# Patient Record
Sex: Male | Born: 1975 | Race: White | Hispanic: No | Marital: Single | Smoking: Former smoker
Health system: Southern US, Community
[De-identification: ages and names within clinical notes are randomized; demographics above are authoritative.]

## PROBLEM LIST (undated history)

## (undated) DIAGNOSIS — H3320 Serous retinal detachment, unspecified eye: Secondary | ICD-10-CM

## (undated) HISTORY — PX: EYE SURGERY: SHX253

## (undated) HISTORY — PX: FRACTURE SURGERY: SHX138

## (undated) HISTORY — PX: FEMUR FRACTURE SURGERY: SHX633

---

## 2005-10-04 HISTORY — PX: ANKLE FRACTURE SURGERY: SHX122

## 2013-10-12 ENCOUNTER — Encounter (INDEPENDENT_AMBULATORY_CARE_PROVIDER_SITE_OTHER): Payer: 59 | Admitting: Ophthalmology

## 2013-10-12 DIAGNOSIS — H33009 Unspecified retinal detachment with retinal break, unspecified eye: Secondary | ICD-10-CM

## 2013-10-12 DIAGNOSIS — S0510XA Contusion of eyeball and orbital tissues, unspecified eye, initial encounter: Secondary | ICD-10-CM

## 2013-10-12 DIAGNOSIS — H2700 Aphakia, unspecified eye: Secondary | ICD-10-CM

## 2013-10-12 DIAGNOSIS — H43819 Vitreous degeneration, unspecified eye: Secondary | ICD-10-CM

## 2013-10-13 NOTE — H&P (Signed)
Sean Walker is an 38 y.o. male.   Chief Complaint:loss of vision eleven months ago left eye HPI: Hx of trauma as a child, then retinal detachment eleven months ago, left eye  No past medical history on file.  No past surgical history on file.  No family history on file. Social History:  has no tobacco, alcohol, and drug history on file.  Allergies: Allergies not on file  No prescriptions prior to admission    Review of systems otherwise negative  There were no vitals taken for this visit.  Physical exam: Mental status: oriented x3. Eyes: See eye exam associated with this date of surgery in media tab.  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

## 2013-10-15 ENCOUNTER — Encounter (HOSPITAL_COMMUNITY): Payer: Self-pay | Admitting: Respiratory Therapy

## 2013-10-15 ENCOUNTER — Other Ambulatory Visit (HOSPITAL_COMMUNITY): Payer: Self-pay | Admitting: *Deleted

## 2013-10-15 ENCOUNTER — Encounter (HOSPITAL_COMMUNITY): Payer: Self-pay | Admitting: *Deleted

## 2013-10-15 MED ORDER — GATIFLOXACIN 0.5 % OP SOLN
1.0000 [drp] | OPHTHALMIC | Status: AC | PRN
  Administered 2013-10-16 (×3): 1 [drp] via OPHTHALMIC

## 2013-10-15 MED ORDER — CYCLOPENTOLATE HCL 1 % OP SOLN
1.0000 [drp] | OPHTHALMIC | Status: AC | PRN
  Administered 2013-10-16 (×3): 1 [drp] via OPHTHALMIC

## 2013-10-15 MED ORDER — PHENYLEPHRINE HCL 2.5 % OP SOLN
1.0000 [drp] | OPHTHALMIC | Status: AC | PRN
  Administered 2013-10-16 (×3): 1 [drp] via OPHTHALMIC

## 2013-10-15 MED ORDER — CEFAZOLIN SODIUM-DEXTROSE 2-3 GM-% IV SOLR
2.0000 g | INTRAVENOUS | Status: AC
  Administered 2013-10-16: 2 g via INTRAVENOUS

## 2013-10-15 MED ORDER — TROPICAMIDE 1 % OP SOLN
1.0000 [drp] | OPHTHALMIC | Status: AC | PRN
  Administered 2013-10-16 (×3): 1 [drp] via OPHTHALMIC

## 2013-10-16 ENCOUNTER — Ambulatory Visit (HOSPITAL_COMMUNITY)
Admission: RE | Admit: 2013-10-16 | Discharge: 2013-10-17 | Disposition: A | Discharge status: Home / Self Care | Payer: 59 | Source: Ambulatory Visit | Attending: Ophthalmology | Admitting: Ophthalmology

## 2013-10-16 ENCOUNTER — Ambulatory Visit (HOSPITAL_COMMUNITY): Payer: 59

## 2013-10-16 ENCOUNTER — Ambulatory Visit (HOSPITAL_COMMUNITY): Payer: 59 | Admitting: Certified Registered Nurse Anesthetist

## 2013-10-16 ENCOUNTER — Encounter (HOSPITAL_COMMUNITY): Payer: 59 | Admitting: Certified Registered Nurse Anesthetist

## 2013-10-16 ENCOUNTER — Encounter (HOSPITAL_COMMUNITY): Payer: Self-pay | Admitting: Surgery

## 2013-10-16 ENCOUNTER — Encounter (HOSPITAL_COMMUNITY)
Admission: RE | Disposition: A | Discharge status: Home / Self Care | Payer: Self-pay | Source: Ambulatory Visit | Attending: Ophthalmology

## 2013-10-16 DIAGNOSIS — H33009 Unspecified retinal detachment with retinal break, unspecified eye: Secondary | ICD-10-CM | POA: Insufficient documentation

## 2013-10-16 DIAGNOSIS — H2700 Aphakia, unspecified eye: Secondary | ICD-10-CM | POA: Insufficient documentation

## 2013-10-16 DIAGNOSIS — H33002 Unspecified retinal detachment with retinal break, left eye: Secondary | ICD-10-CM

## 2013-10-16 DIAGNOSIS — H3520 Other non-diabetic proliferative retinopathy, unspecified eye: Secondary | ICD-10-CM | POA: Insufficient documentation

## 2013-10-16 DIAGNOSIS — Z87891 Personal history of nicotine dependence: Secondary | ICD-10-CM | POA: Insufficient documentation

## 2013-10-16 HISTORY — PX: MEMBRANE PEEL: SHX5967

## 2013-10-16 HISTORY — DX: Serous retinal detachment, unspecified eye: H33.20

## 2013-10-16 HISTORY — PX: PERFLUORONE INJECTION: SHX5302

## 2013-10-16 HISTORY — PX: INJECTION OF SILICONE OIL: SHX6422

## 2013-10-16 HISTORY — PX: PHOTOCOAGULATION WITH LASER: SHX6027

## 2013-10-16 HISTORY — PX: IRIDECTOMY: SHX1848

## 2013-10-16 HISTORY — PX: SCLERAL BUCKLE WITH POSSIBLE 25 GAUGE PARS PLANA VITRECTOMY: SHX6206

## 2013-10-16 HISTORY — PX: RETINAL DETACHMENT REPAIR W/ SCLERAL BUCKLE LE: SHX2338

## 2013-10-16 HISTORY — PX: GAS/FLUID EXCHANGE: SHX5334

## 2013-10-16 LAB — CBC
HEMATOCRIT: 41 % (ref 39.0–52.0)
Hemoglobin: 14.8 g/dL (ref 13.0–17.0)
MCH: 32 pg (ref 26.0–34.0)
MCHC: 36.1 g/dL — ABNORMAL HIGH (ref 30.0–36.0)
MCV: 88.7 fL (ref 78.0–100.0)
Platelets: 221 10*3/uL (ref 150–400)
RBC: 4.62 MIL/uL (ref 4.22–5.81)
RDW: 12.8 % (ref 11.5–15.5)
WBC: 6.8 10*3/uL (ref 4.0–10.5)

## 2013-10-16 SURGERY — SCLERAL BUCKLE WITH POSSIBLE 25 GAUGE PARS PLANA VITRECTOMY
Anesthesia: General | Site: Eye | Laterality: Left

## 2013-10-16 MED ORDER — BACITRACIN-POLYMYXIN B 500-10000 UNIT/GM OP OINT
TOPICAL_OINTMENT | OPHTHALMIC | Status: DC | PRN
  Administered 2013-10-16: 1 via OPHTHALMIC

## 2013-10-16 MED ORDER — GENTAMICIN SULFATE 40 MG/ML IJ SOLN
INTRAMUSCULAR | Status: AC
Start: 2013-10-16 — End: 2013-10-16
  Filled 2013-10-16: qty 2

## 2013-10-16 MED ORDER — EPINEPHRINE HCL 1 MG/ML IJ SOLN
INTRAMUSCULAR | Status: DC | PRN
  Administered 2013-10-16: 12:00:00

## 2013-10-16 MED ORDER — HYDROCODONE-ACETAMINOPHEN 5-325 MG PO TABS
1.0000 | ORAL_TABLET | ORAL | Status: DC | PRN
  Administered 2013-10-17: 2 via ORAL
  Filled 2013-10-16 (×2): qty 2

## 2013-10-16 MED ORDER — DEXAMETHASONE SODIUM PHOSPHATE 10 MG/ML IJ SOLN
INTRAMUSCULAR | Status: DC | PRN
  Administered 2013-10-16: 10 mg

## 2013-10-16 MED ORDER — BUPIVACAINE HCL (PF) 0.75 % IJ SOLN
INTRAMUSCULAR | Status: DC | PRN
  Administered 2013-10-16: 10 mL

## 2013-10-16 MED ORDER — SODIUM CHLORIDE 0.9 % IJ SOLN
INTRAMUSCULAR | Status: AC
  Filled 2013-10-16: qty 10

## 2013-10-16 MED ORDER — NEOSTIGMINE METHYLSULFATE 1 MG/ML IJ SOLN
INTRAMUSCULAR | Status: DC | PRN
  Administered 2013-10-16: 5 mg via INTRAVENOUS

## 2013-10-16 MED ORDER — TEMAZEPAM 15 MG PO CAPS: 15.0000 mg | ORAL_CAPSULE | Freq: Every evening | ORAL | Status: DC | PRN

## 2013-10-16 MED ORDER — ACETAMINOPHEN 325 MG PO TABS: 325.0000 mg | ORAL_TABLET | ORAL | Status: DC | PRN

## 2013-10-16 MED ORDER — SODIUM CHLORIDE 0.45 % IV SOLN
INTRAVENOUS | Status: DC
  Administered 2013-10-16: 17:00:00 via INTRAVENOUS

## 2013-10-16 MED ORDER — 0.9 % SODIUM CHLORIDE (POUR BTL) OPTIME
TOPICAL | Status: DC | PRN
  Administered 2013-10-16: 1000 mL

## 2013-10-16 MED ORDER — SODIUM CHLORIDE 0.9 % IV SOLN
INTRAVENOUS | Status: DC
  Administered 2013-10-16 (×3): via INTRAVENOUS

## 2013-10-16 MED ORDER — DEXAMETHASONE SODIUM PHOSPHATE 10 MG/ML IJ SOLN
INTRAMUSCULAR | Status: AC
  Filled 2013-10-16: qty 1

## 2013-10-16 MED ORDER — PHENYLEPHRINE HCL 2.5 % OP SOLN
OPHTHALMIC | Status: AC
  Administered 2013-10-16: 10:00:00 1 [drp] via OPHTHALMIC
  Filled 2013-10-16: qty 15

## 2013-10-16 MED ORDER — GLYCOPYRROLATE 0.2 MG/ML IJ SOLN
INTRAMUSCULAR | Status: DC | PRN
Start: 2013-10-16 — End: 2013-10-16
  Administered 2013-10-16: .8 mg via INTRAVENOUS
  Administered 2013-10-16: 0.2 mg via INTRAVENOUS

## 2013-10-16 MED ORDER — EPINEPHRINE HCL 1 MG/ML IJ SOLN
INTRAMUSCULAR | Status: AC
  Filled 2013-10-16: qty 1

## 2013-10-16 MED ORDER — ONDANSETRON HCL 4 MG/2ML IJ SOLN
4.0000 mg | Freq: Four times a day (QID) | INTRAMUSCULAR | Status: DC | PRN
  Administered 2013-10-16: 4 mg via INTRAVENOUS
  Filled 2013-10-16: qty 2

## 2013-10-16 MED ORDER — ACETAZOLAMIDE SODIUM 500 MG IJ SOLR
INTRAMUSCULAR | Status: AC
  Filled 2013-10-16: qty 500

## 2013-10-16 MED ORDER — GATIFLOXACIN 0.5 % OP SOLN
1.0000 [drp] | Freq: Four times a day (QID) | OPHTHALMIC | Status: DC
  Filled 2013-10-16: qty 2.5

## 2013-10-16 MED ORDER — BACITRACIN-POLYMYXIN B 500-10000 UNIT/GM OP OINT
TOPICAL_OINTMENT | OPHTHALMIC | Status: AC
  Filled 2013-10-16: qty 3.5

## 2013-10-16 MED ORDER — LIDOCAINE HCL 2 % IJ SOLN
INTRAMUSCULAR | Status: AC
  Filled 2013-10-16: qty 20

## 2013-10-16 MED ORDER — MORPHINE SULFATE 2 MG/ML IJ SOLN
1.0000 mg | INTRAMUSCULAR | Status: AC | PRN
  Administered 2013-10-16 – 2013-10-17 (×2): 2 mg via INTRAVENOUS
  Filled 2013-10-16 (×2): qty 1

## 2013-10-16 MED ORDER — FENTANYL CITRATE 0.05 MG/ML IJ SOLN
INTRAMUSCULAR | Status: DC | PRN
  Administered 2013-10-16: 50 ug via INTRAVENOUS
  Administered 2013-10-16 (×2): 100 ug via INTRAVENOUS
  Administered 2013-10-16: 50 ug via INTRAVENOUS

## 2013-10-16 MED ORDER — OXYCODONE HCL 5 MG/5ML PO SOLN: 5.0000 mg | Freq: Once | ORAL | Status: AC | PRN

## 2013-10-16 MED ORDER — PREDNISOLONE ACETATE 1 % OP SUSP
1.0000 [drp] | Freq: Four times a day (QID) | OPHTHALMIC | Status: DC
  Filled 2013-10-16: qty 1
  Filled 2013-10-16: qty 5

## 2013-10-16 MED ORDER — SODIUM HYALURONATE 10 MG/ML IO SOLN
INTRAOCULAR | Status: AC
  Filled 2013-10-16: qty 0.85

## 2013-10-16 MED ORDER — TROPICAMIDE 1 % OP SOLN
OPHTHALMIC | Status: AC
  Administered 2013-10-16: 10:00:00 1 [drp] via OPHTHALMIC
  Filled 2013-10-16: qty 3

## 2013-10-16 MED ORDER — HEMOSTATIC AGENTS (NO CHARGE) OPTIME
TOPICAL | Status: DC | PRN
  Administered 2013-10-16: 1 via TOPICAL

## 2013-10-16 MED ORDER — POLYMYXIN B SULFATE 500000 UNITS IJ SOLR
INTRAMUSCULAR | Status: DC | PRN
  Administered 2013-10-16: 12:00:00

## 2013-10-16 MED ORDER — ATROPINE SULFATE 1 % OP SOLN
OPHTHALMIC | Status: AC
  Filled 2013-10-16: qty 2

## 2013-10-16 MED ORDER — HYPROMELLOSE (GONIOSCOPIC) 2.5 % OP SOLN
OPHTHALMIC | Status: AC
  Filled 2013-10-16: qty 15

## 2013-10-16 MED ORDER — BACITRACIN-POLYMYXIN B 500-10000 UNIT/GM OP OINT
1.0000 "application " | TOPICAL_OINTMENT | Freq: Four times a day (QID) | OPHTHALMIC | Status: DC
  Filled 2013-10-16: qty 3.5

## 2013-10-16 MED ORDER — TETRACAINE HCL 0.5 % OP SOLN
2.0000 [drp] | Freq: Once | OPHTHALMIC | Status: DC
  Filled 2013-10-16: qty 2

## 2013-10-16 MED ORDER — PROPOFOL 10 MG/ML IV BOLUS
INTRAVENOUS | Status: DC | PRN
  Administered 2013-10-16: 200 mg via INTRAVENOUS

## 2013-10-16 MED ORDER — BUPIVACAINE HCL (PF) 0.75 % IJ SOLN
INTRAMUSCULAR | Status: AC
Start: 2013-10-16 — End: 2013-10-16
  Filled 2013-10-16: qty 10

## 2013-10-16 MED ORDER — ONDANSETRON HCL 4 MG/2ML IJ SOLN
INTRAMUSCULAR | Status: DC | PRN
  Administered 2013-10-16: 4 mg via INTRAVENOUS

## 2013-10-16 MED ORDER — ROCURONIUM BROMIDE 100 MG/10ML IV SOLN
INTRAVENOUS | Status: DC | PRN
  Administered 2013-10-16: 50 mg via INTRAVENOUS
  Administered 2013-10-16: 20 mg via INTRAVENOUS
  Administered 2013-10-16: 30 mg via INTRAVENOUS

## 2013-10-16 MED ORDER — TRIAMCINOLONE ACETONIDE 40 MG/ML IJ SUSP
INTRAMUSCULAR | Status: AC
  Filled 2013-10-16: qty 5

## 2013-10-16 MED ORDER — ONDANSETRON HCL 4 MG/2ML IJ SOLN: 4.0000 mg | Freq: Once | INTRAMUSCULAR | Status: DC | PRN

## 2013-10-16 MED ORDER — POLYMYXIN B SULFATE 500000 UNITS IJ SOLR
INTRAMUSCULAR | Status: AC
  Filled 2013-10-16: qty 1

## 2013-10-16 MED ORDER — LIDOCAINE HCL (CARDIAC) 20 MG/ML IV SOLN
INTRAVENOUS | Status: DC | PRN
  Administered 2013-10-16: 70 mg via INTRAVENOUS

## 2013-10-16 MED ORDER — MAGNESIUM HYDROXIDE 400 MG/5ML PO SUSP: 15.0000 mL | Freq: Four times a day (QID) | ORAL | Status: DC | PRN

## 2013-10-16 MED ORDER — DOCUSATE SODIUM 100 MG PO CAPS
100.0000 mg | ORAL_CAPSULE | Freq: Two times a day (BID) | ORAL | Status: DC
  Filled 2013-10-16: qty 1

## 2013-10-16 MED ORDER — CYCLOPENTOLATE HCL 1 % OP SOLN
OPHTHALMIC | Status: AC
  Administered 2013-10-16: 10:00:00 1 [drp] via OPHTHALMIC
  Filled 2013-10-16: qty 2

## 2013-10-16 MED ORDER — BSS PLUS IO SOLN
INTRAOCULAR | Status: AC
  Filled 2013-10-16: qty 500

## 2013-10-16 MED ORDER — BRIMONIDINE TARTRATE 0.2 % OP SOLN
1.0000 [drp] | Freq: Two times a day (BID) | OPHTHALMIC | Status: DC
  Filled 2013-10-16: qty 5

## 2013-10-16 MED ORDER — SODIUM HYALURONATE 10 MG/ML IO SOLN
INTRAOCULAR | Status: DC | PRN
  Administered 2013-10-16: 0.85 mL via INTRAOCULAR

## 2013-10-16 MED ORDER — MIDAZOLAM HCL 5 MG/5ML IJ SOLN
INTRAMUSCULAR | Status: DC | PRN
  Administered 2013-10-16: 2 mg via INTRAVENOUS

## 2013-10-16 MED ORDER — LATANOPROST 0.005 % OP SOLN
1.0000 [drp] | Freq: Every day | OPHTHALMIC | Status: DC
  Filled 2013-10-16: qty 2.5

## 2013-10-16 MED ORDER — HYDROMORPHONE HCL PF 1 MG/ML IJ SOLN
0.2500 mg | INTRAMUSCULAR | Status: DC | PRN
  Administered 2013-10-16 (×2): 0.5 mg via INTRAVENOUS

## 2013-10-16 MED ORDER — HYDROMORPHONE HCL PF 1 MG/ML IJ SOLN
INTRAMUSCULAR | Status: AC
  Filled 2013-10-16: qty 1

## 2013-10-16 MED ORDER — ACETAZOLAMIDE SODIUM 500 MG IJ SOLR
500.0000 mg | Freq: Once | INTRAMUSCULAR | Status: AC
  Administered 2013-10-17: 500 mg via INTRAVENOUS
  Filled 2013-10-16: qty 500

## 2013-10-16 MED ORDER — GATIFLOXACIN 0.5 % OP SOLN
OPHTHALMIC | Status: AC
  Administered 2013-10-16: 10:00:00 1 [drp] via OPHTHALMIC
  Filled 2013-10-16: qty 2.5

## 2013-10-16 MED ORDER — BSS IO SOLN
INTRAOCULAR | Status: AC
  Filled 2013-10-16: qty 15

## 2013-10-16 MED ORDER — OXYCODONE HCL 5 MG PO TABS
5.0000 mg | ORAL_TABLET | Freq: Once | ORAL | Status: AC | PRN
  Administered 2013-10-16: 5 mg via ORAL

## 2013-10-16 MED ORDER — OXYCODONE HCL 5 MG PO TABS
ORAL_TABLET | ORAL | Status: AC
  Filled 2013-10-16: qty 1

## 2013-10-16 SURGICAL SUPPLY — 90 items
APPLICATOR DR MATTHEWS STRL (MISCELLANEOUS) ×24 IMPLANT
BLADE EYE CATARACT 19 1.4 BEAV (BLADE) IMPLANT
BLADE MVR KNIFE 19G (BLADE) IMPLANT
BLADE SURG 15 STRL LF DISP TIS (BLADE) IMPLANT
BLADE SURG 15 STRL SS (BLADE)
CANNULA ANT CHAM MAIN (OPHTHALMIC RELATED) IMPLANT
CANNULA DUAL BORE 23G (CANNULA) ×3 IMPLANT
CANNULA TROCAR 23 GA VLV (OPHTHALMIC) ×3 IMPLANT
CANNULA VLV SOFT TIP 25GA (OPHTHALMIC) ×3 IMPLANT
CORDS BIPOLAR (ELECTRODE) IMPLANT
COTTONBALL LRG STERILE PKG (GAUZE/BANDAGES/DRESSINGS) ×9 IMPLANT
COVER MAYO STAND STRL (DRAPES) ×3 IMPLANT
COVER SURGICAL LIGHT HANDLE (MISCELLANEOUS) ×3 IMPLANT
DRAPE INCISE 51X51 W/FILM STRL (DRAPES) ×3 IMPLANT
DRAPE OPHTHALMIC 77X100 STRL (CUSTOM PROCEDURE TRAY) ×3 IMPLANT
ERASER HMR WETFIELD 23G BP (MISCELLANEOUS) IMPLANT
FILTER BLUE MILLIPORE (MISCELLANEOUS) ×6 IMPLANT
FILTER STRAW FLUID ASPIR (MISCELLANEOUS) IMPLANT
FORCEPS GRIESHABER ILM 25G A (INSTRUMENTS) IMPLANT
GAS OPHTHALMIC (MISCELLANEOUS) IMPLANT
GLOVE BIOGEL PI IND STRL 6.5 (GLOVE) ×1 IMPLANT
GLOVE BIOGEL PI IND STRL 7.0 (GLOVE) ×3 IMPLANT
GLOVE BIOGEL PI IND STRL 7.5 (GLOVE) ×1 IMPLANT
GLOVE BIOGEL PI INDICATOR 6.5 (GLOVE) ×2
GLOVE BIOGEL PI INDICATOR 7.0 (GLOVE) ×6
GLOVE BIOGEL PI INDICATOR 7.5 (GLOVE) ×2
GLOVE SS BIOGEL STRL SZ 6.5 (GLOVE) ×2 IMPLANT
GLOVE SS BIOGEL STRL SZ 7 (GLOVE) ×1 IMPLANT
GLOVE SUPERSENSE BIOGEL SZ 6.5 (GLOVE) ×4
GLOVE SUPERSENSE BIOGEL SZ 7 (GLOVE) ×2
GLOVE SURG 8.5 LATEX PF (GLOVE) ×6 IMPLANT
GLOVE SURG SS PI 6.5 STRL IVOR (GLOVE) ×6 IMPLANT
GLOVE SURG SS PI 7.0 STRL IVOR (GLOVE) ×6 IMPLANT
GOWN STRL NON-REIN LRG LVL3 (GOWN DISPOSABLE) ×6 IMPLANT
HANDLE PNEUMATIC FOR CONSTEL (OPHTHALMIC) IMPLANT
IMPL SILICONE (Ophthalmic Related) ×1 IMPLANT
IMPLANT SILICONE (Ophthalmic Related) ×5 IMPLANT
KIT BASIN OR (CUSTOM PROCEDURE TRAY) ×3 IMPLANT
KIT PERFLUORON PROCEDURE 5ML (MISCELLANEOUS) ×3 IMPLANT
KIT ROOM TURNOVER OR (KITS) ×3 IMPLANT
KNIFE CRESCENT 1.75 EDGEAHEAD (BLADE) IMPLANT
KNIFE GRIESHABER SHARP 2.5MM (MISCELLANEOUS) ×12 IMPLANT
MASK EYE SHIELD (GAUZE/BANDAGES/DRESSINGS) ×3 IMPLANT
MICROPICK 25G (MISCELLANEOUS)
NEEDLE 18GX1X1/2 (RX/OR ONLY) (NEEDLE) ×6 IMPLANT
NEEDLE 25GX 5/8IN NON SAFETY (NEEDLE) IMPLANT
NEEDLE 27GAX1X1/2 (NEEDLE) IMPLANT
NEEDLE FILTER BLUNT 18X 1/2SAF (NEEDLE) ×2
NEEDLE FILTER BLUNT 18X1 1/2 (NEEDLE) ×1 IMPLANT
NEEDLE HYPO 30X.5 LL (NEEDLE) ×9 IMPLANT
NS IRRIG 1000ML POUR BTL (IV SOLUTION) ×3 IMPLANT
OIL SILICONE OPHTHALMIC ADAPTO (Ophthalmic Related) ×3 IMPLANT
PACK VITRECTOMY CUSTOM (CUSTOM PROCEDURE TRAY) ×3 IMPLANT
PAD ARMBOARD 7.5X6 YLW CONV (MISCELLANEOUS) ×6 IMPLANT
PAD EYE OVAL STERILE LF (GAUZE/BANDAGES/DRESSINGS) ×3 IMPLANT
PAK PIK VITRECTOMY CVS 25GA (OPHTHALMIC) ×3 IMPLANT
PAK VITRECTOMY PIK 25 GA (OPHTHALMIC RELATED) IMPLANT
PIC ILLUMINATED 25G (OPHTHALMIC) ×3
PICK MICROPICK 25G (MISCELLANEOUS) IMPLANT
PIK ILLUMINATED 25G (OPHTHALMIC) ×1 IMPLANT
PROBE LASER ILLUM FLEX CVD 25G (OPHTHALMIC) ×3 IMPLANT
REPL STRA BRUSH NEEDLE (NEEDLE) IMPLANT
RESERVOIR BACK FLUSH (MISCELLANEOUS) IMPLANT
ROLLS DENTAL (MISCELLANEOUS) ×6 IMPLANT
SET FLUID INJECTOR (SET/KITS/TRAYS/PACK) IMPLANT
SET INJECTOR OIL FLUID CONSTEL (OPHTHALMIC) ×3 IMPLANT
SET VGFI TUBING 8065808002 (SET/KITS/TRAYS/PACK) IMPLANT
SLEEVE SCLERAL TYPE 270 (Ophthalmic Related) ×3 IMPLANT
SPEAR EYE SURG WECK-CEL (MISCELLANEOUS) ×21 IMPLANT
SPONGE SURGIFOAM ABS GEL 12-7 (HEMOSTASIS) ×3 IMPLANT
STOPCOCK 4 WAY LG BORE MALE ST (IV SETS) IMPLANT
SUT CHROMIC 7 0 TG140 8 (SUTURE) ×3 IMPLANT
SUT ETHILON 9 0 TG140 8 (SUTURE) IMPLANT
SUT MERSILENE 4 0 RV 2 (SUTURE) ×6 IMPLANT
SUT SILK 2 0 (SUTURE) ×2
SUT SILK 2-0 18XBRD TIE 12 (SUTURE) ×1 IMPLANT
SUT SILK 4 0 RB 1 (SUTURE) ×3 IMPLANT
SUT VICRYL 7 0 TG140 8 (SUTURE) IMPLANT
SYR 20CC LL (SYRINGE) ×3 IMPLANT
SYR 5ML LL (SYRINGE) ×3 IMPLANT
SYR BULB 3OZ (MISCELLANEOUS) ×3 IMPLANT
SYR TB 1ML LUER SLIP (SYRINGE) ×3 IMPLANT
SYRINGE 10CC LL (SYRINGE) ×3 IMPLANT
TAPE SURG TRANSPORE 1 IN (GAUZE/BANDAGES/DRESSINGS) ×1 IMPLANT
TAPE SURGICAL TRANSPORE 1 IN (GAUZE/BANDAGES/DRESSINGS) ×2
TIRE RTNL 2.5XGRV CNCV 9X (Ophthalmic Related) ×1 IMPLANT
TOWEL OR 17X24 6PK STRL BLUE (TOWEL DISPOSABLE) ×9 IMPLANT
TUBING ART PRESS 12 MALE/MALE (MISCELLANEOUS) IMPLANT
WATER STERILE IRR 1000ML POUR (IV SOLUTION) ×3 IMPLANT
WIPE INSTRUMENT VISIWIPE 73X73 (MISCELLANEOUS) ×3 IMPLANT

## 2013-10-16 NOTE — Anesthesia Preprocedure Evaluation (Signed)
Anesthesia Evaluation  Patient identified by MRN, date of birth, ID band Patient awake    Reviewed: Allergy & Precautions, H&P , NPO status , Patient's Chart, lab work & pertinent test results  Airway Mallampati: I TM Distance: >3 FB Neck ROM: Full    Dental  (+) Teeth Intact and Dental Advisory Given   Pulmonary former smoker,  breath sounds clear to auscultation        Cardiovascular Rhythm:Regular Rate:Normal     Neuro/Psych    GI/Hepatic   Endo/Other    Renal/GU      Musculoskeletal   Abdominal   Peds  Hematology   Anesthesia Other Findings   Reproductive/Obstetrics                           Anesthesia Physical Anesthesia Plan  ASA: I  Anesthesia Plan: General   Post-op Pain Management:    Induction: Intravenous  Airway Management Planned: Oral ETT  Additional Equipment:   Intra-op Plan:   Post-operative Plan: Extubation in OR  Informed Consent: I have reviewed the patients History and Physical, chart, labs and discussed the procedure including the risks, benefits and alternatives for the proposed anesthesia with the patient or authorized representative who has indicated his/her understanding and acceptance.   Dental advisory given  Plan Discussed with: CRNA, Anesthesiologist and Surgeon  Anesthesia Plan Comments:         Anesthesia Quick Evaluation

## 2013-10-16 NOTE — Transfer of Care (Signed)
Immediate Anesthesia Transfer of Care Note  Patient: Caron PresumeZachariah Saavedra  Procedure(s) Performed: Procedure(s) with comments: SCLERAL BUCKLE WITH 23 and 25 GAUGE PARS PLANA VITRECTOMY  (Left) AIR/FLUID EXCHANGE (Left) PHOTOCOAGULATION WITH LASER (Left) - ENDOLASER PERFLUORON  INJECTION (Left) MEMBRANE PEEL (Left) IRIDECTOMY (Left) INJECTION OF SILICONE OIL (Left)  Patient Location: PACU  Anesthesia Type:General  Level of Consciousness: awake and alert   Airway & Oxygen Therapy: Patient Spontanous Breathing and Patient connected to nasal cannula oxygen  Post-op Assessment: Report given to PACU RN, Post -op Vital signs reviewed and stable and Patient moving all extremities X 4  Post vital signs: Reviewed and stable  Complications: No apparent anesthesia complications

## 2013-10-16 NOTE — Brief Op Note (Signed)
Brief Operative note   Preoperative diagnosis:  retinal detachment with PVR Postoperative diagnosis  Post-Op Diagnosis Codes:    * Retinal detachment with retinal defect, unspecified [361.00]  Procedures: Repair of complex retinal detachment with scleral buckle, vitrectomy, laser, gas injection, perfluoron injection and removal, silicone oil injection. Left eye  Surgeon:  Sherrie GeorgeJohn D Matthews, MD...  Assistant:  Rosalie DoctorLisa Johnson SA    Anesthesia: General  Specimen: none  Estimated blood loss:  1cc  Complications: none  Patient sent to PACU in good condition  Composed by Sherrie GeorgeJohn D Matthews MD  Dictation number: [id number]

## 2013-10-16 NOTE — H&P (Signed)
I examined the patient today and there is no change in the medical status 

## 2013-10-16 NOTE — Anesthesia Postprocedure Evaluation (Signed)
  Anesthesia Post-op Note  Patient: Sean Walker  Procedure(s) Performed: Procedure(s) with comments: SCLERAL BUCKLE WITH 23 and 25 GAUGE PARS PLANA VITRECTOMY  (Left) AIR/FLUID EXCHANGE (Left) PHOTOCOAGULATION WITH LASER (Left) - ENDOLASER PERFLUORON  INJECTION (Left) MEMBRANE PEEL (Left) IRIDECTOMY (Left) INJECTION OF SILICONE OIL (Left)  Patient Location: PACU  Anesthesia Type:General  Level of Consciousness: awake, alert  and oriented  Airway and Oxygen Therapy: Patient Spontanous Breathing  Post-op Pain: none  Post-op Assessment: Post-op Vital signs reviewed, Patient's Cardiovascular Status Stable, Respiratory Function Stable, Patent Airway, No signs of Nausea or vomiting and Pain level controlled  Post-op Vital Signs: Reviewed and stable  Complications: No apparent anesthesia complications

## 2013-10-16 NOTE — Preoperative (Signed)
Beta Blockers   Reason not to administer Beta Blockers:Not Applicable 

## 2013-10-16 NOTE — Anesthesia Procedure Notes (Signed)
Procedure Name: Intubation Date/Time: 10/16/2013 11:45 AM Performed by: Reine JustFLOWERS, Aydan Phoenix T Pre-anesthesia Checklist: Patient identified, Emergency Drugs available, Suction available, Patient being monitored and Timeout performed Patient Re-evaluated:Patient Re-evaluated prior to inductionOxygen Delivery Method: Circle system utilized and Simple face mask Preoxygenation: Pre-oxygenation with 100% oxygen Intubation Type: IV induction Ventilation: Mask ventilation without difficulty Laryngoscope Size: Miller and 3 Grade View: Grade II Tube type: Oral Tube size: 7.5 mm Number of attempts: 1 Airway Equipment and Method: Patient positioned with wedge pillow and Stylet Placement Confirmation: ETT inserted through vocal cords under direct vision,  positive ETCO2 and breath sounds checked- equal and bilateral Secured at: 23 cm Tube secured with: Tape Dental Injury: Teeth and Oropharynx as per pre-operative assessment

## 2013-10-17 DIAGNOSIS — H33009 Unspecified retinal detachment with retinal break, unspecified eye: Secondary | ICD-10-CM

## 2013-10-17 MED ORDER — GATIFLOXACIN 0.5 % OP SOLN: 1.0000 [drp] | Freq: Four times a day (QID) | OPHTHALMIC | Status: DC

## 2013-10-17 MED ORDER — BACITRACIN-POLYMYXIN B 500-10000 UNIT/GM OP OINT: 1.0000 "application " | TOPICAL_OINTMENT | Freq: Four times a day (QID) | OPHTHALMIC | Status: DC

## 2013-10-17 MED ORDER — HYDROCODONE-ACETAMINOPHEN 5-325 MG PO TABS: 1.0000 | ORAL_TABLET | ORAL | Status: AC | PRN

## 2013-10-17 MED ORDER — LATANOPROST 0.005 % OP SOLN: 1.0000 [drp] | Freq: Every day | OPHTHALMIC | Status: AC

## 2013-10-17 MED ORDER — PREDNISOLONE ACETATE 1 % OP SUSP
1.0000 [drp] | Freq: Four times a day (QID) | OPHTHALMIC | Status: DC
Start: 2013-10-17 — End: 2014-06-11

## 2013-10-17 NOTE — Op Note (Signed)
NAMEROSEMARY, PENTECOST             ACCOUNT NO.:  [id number]  MEDICAL RECORD NO.:  [id number]  LOCATION:  6N22C                        FACILITY:  [facility]  PHYSICIAN:  Beulah Gandy. Ashley Royalty, M.D. 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  PROCEDURES:  Scleral buckle, pars plana vitrectomy, retinal photocoagulation, Perfluoron injection, Perfluoron removal, gas fluid exchange, silicone oil placement, retinal photocoagulation, and peripheral iridectomy, all in the left eye.  SURGEON:  Beulah Gandy. Ashley Royalty, M.D.  ASSISTANT:  Rosalie Doctor, SA.  ANESTHESIA:  General.  DETAILS:  After usual prep and drape, 360 degree limbal peritomy, isolation of 4 rectus muscles on 2-0 silk.  Scleral dissection for 360 degrees to admit a #279 intrascleral implant.  Diathermy placed in the bed.  Two sutures per quadrant for total of 8 scleral sutures were placed in the scleral flaps, 279 implant was placed around the globe with a 240 band and a 270 sleeve.  One 2 mm were trimmed from the posterior edge of the scleral buckle.  Once the buckle was placed, perforation was chosen at 5 o'clock.  A large amount of yellow thin subretinal fluid came forth in a controlled manner.  The buckle elements were placed.  The scleral flaps were closed to perforate.  The attention was then carried to the pars plana area where 25-gauge trocars were placed at 10 and 4 o'clock and 23-gauge at 2 o'clock.  Provisc was placed on the corneal surface.  Pars plana vitrectomy was begun just behind the pupillary axis.  A peripheral iridectomy was created at 6 o'clock with the vitreous cutter.  The retina was thrown into folds with star folds and PVR.  Careful attention was taken to removing all vitreous attachments to the  macula and to the retinal surface.  PFO was injected as membranes were stripped from the retinal surface.  These membranes were then trimmed and removed with the vitreous cutter.  The standard and the wide field BIOM viewing system were used for best viewing.  The external drain was used for additional subretinal fluid removal.  PFO was injected and BSS was injected as fluid egress to the external drain.  The fluid was darky yellow in color.  Gas fluid exchange was carried out 50% and PFO was placed 50% to reattach the retina.  Once all of the subretinal fluid was drained through the external drain.  A total gas fluid exchange was carried out and the PFO was carefully removed.  Every particles PFO was removed.  The endolaser was then positioned in the eye, 827 burns were placed around the retinal periphery.  The power of 400 mW, 1000 microns each and 0.1 seconds each. The retina was fully attached at this point, with star folds on the scleral buckle.  Silicone oil was then injected into the vitreous cavity for a permanent attachment.  The level of oil was brought just right to the pupillary axis.  No implant or lens was present.  The patient was aphakic.  The pressures were  adjusted.  The trocars were removed.  The scleral sutures were knotted and the free ends removed.  The buckle was adjusted.  The band was adjusted.  The buckle and band were trimmed. The conjunctiva was reposited with 7-0 chromic suture.  Polymyxin and gentamicin were irrigated into tenon space.  Marcaine was injected around the globe for postop pain.  Atropine solution was applied. Decadron 10 mg was injected into the lower conjunctival space.  The closing pressure was 10 with a Baer care tonometer.  COMPLICATIONS:  None.  DURATION:  3 hours.  Polysporin ophthalmic ointment, a patch and shield were placed.  The patient was awakened, taken to recovery in satisfactory condition.     Beulah GandyJohn D. Ashley RoyaltyMatthews,  M.D.     JDM/MEDQ  D:  10/16/2013  T:  10/17/2013  Job:  [id number]

## 2013-10-17 NOTE — Discharge Summary (Signed)
Discharge summary not needed on [facility] patients per medical records. 

## 2013-10-17 NOTE — Progress Notes (Signed)
10/17/2013, 6:36 AM  Mental Status:  Awake, Alert, Oriented  Anterior segment: Cornea  Clear    Anterior Chamber Clear    Lens:   Aphakia  Intra Ocular Pressure 32 mmHg with Tonopen  Vitreous:   Silicone oil  Retina:  Attached Good laser reaction   Impression: Excellent result Retina attached   Final Diagnosis: Active Problems:   Rhegmatogenous retinal detachment of left eye   Plan: start post operative eye drops.  Add glaucoma drops.  Discharge to home.  Give post operative instructions  Sherrie GeorgeMATTHEWS, Kamyrah Feeser D 10/17/2013, 6:36 AM

## 2013-10-18 ENCOUNTER — Encounter (HOSPITAL_COMMUNITY): Payer: Self-pay | Admitting: Ophthalmology

## 2013-10-22 ENCOUNTER — Inpatient Hospital Stay (INDEPENDENT_AMBULATORY_CARE_PROVIDER_SITE_OTHER): Payer: 59 | Admitting: Ophthalmology

## 2013-10-22 DIAGNOSIS — H33009 Unspecified retinal detachment with retinal break, unspecified eye: Secondary | ICD-10-CM

## 2013-10-23 ENCOUNTER — Encounter (INDEPENDENT_AMBULATORY_CARE_PROVIDER_SITE_OTHER): Payer: 59 | Admitting: Ophthalmology

## 2013-10-23 DIAGNOSIS — H33009 Unspecified retinal detachment with retinal break, unspecified eye: Secondary | ICD-10-CM

## 2013-10-29 ENCOUNTER — Encounter (INDEPENDENT_AMBULATORY_CARE_PROVIDER_SITE_OTHER): Payer: 59 | Admitting: Ophthalmology

## 2013-10-29 DIAGNOSIS — H33009 Unspecified retinal detachment with retinal break, unspecified eye: Secondary | ICD-10-CM

## 2013-11-12 ENCOUNTER — Encounter (INDEPENDENT_AMBULATORY_CARE_PROVIDER_SITE_OTHER): Payer: 59 | Admitting: Ophthalmology

## 2013-11-12 DIAGNOSIS — H33009 Unspecified retinal detachment with retinal break, unspecified eye: Secondary | ICD-10-CM

## 2013-11-19 ENCOUNTER — Ambulatory Visit (INDEPENDENT_AMBULATORY_CARE_PROVIDER_SITE_OTHER): Payer: 59 | Admitting: Ophthalmology

## 2013-11-20 ENCOUNTER — Encounter (INDEPENDENT_AMBULATORY_CARE_PROVIDER_SITE_OTHER): Payer: 59 | Admitting: Ophthalmology

## 2013-11-26 ENCOUNTER — Ambulatory Visit (INDEPENDENT_AMBULATORY_CARE_PROVIDER_SITE_OTHER): Payer: 59 | Admitting: Ophthalmology

## 2014-04-01 ENCOUNTER — Encounter (INDEPENDENT_AMBULATORY_CARE_PROVIDER_SITE_OTHER): Payer: 59 | Admitting: Ophthalmology

## 2014-04-01 DIAGNOSIS — H43819 Vitreous degeneration, unspecified eye: Secondary | ICD-10-CM

## 2014-04-01 DIAGNOSIS — H33009 Unspecified retinal detachment with retinal break, unspecified eye: Secondary | ICD-10-CM

## 2014-05-29 ENCOUNTER — Encounter (INDEPENDENT_AMBULATORY_CARE_PROVIDER_SITE_OTHER): Payer: 59 | Admitting: Ophthalmology

## 2014-06-04 NOTE — H&P (Signed)
Sean Walker is an 38 y.o. male.   Chief Complaint:poor vision left eye HPI: had retina repaired with silicone oil, now time to remove oil left eye  Past Medical History  Diagnosis Date  . Detached retina     left eye    Past Surgical History  Procedure Laterality Date  . Fracture surgery    . Ankle fracture surgery Right 2007     pins/screws  . Femur fracture surgery Left     as a child  . Retinal detachment repair w/ scleral buckle le Left 10/16/2013  . Eye surgery Left     eye injury surgery as a child  . Scleral buckle with possible 25 gauge pars plana vitrectomy Left 10/16/2013    Procedure: SCLERAL BUCKLE WITH 23 and 25 GAUGE PARS PLANA VITRECTOMY ;  Surgeon: Sherrie George, MD;  Location: [facility] OR;  Service: Ophthalmology;  Laterality: Left;  . Gas/fluid exchange Left 10/16/2013    Procedure: AIR/FLUID EXCHANGE;  Surgeon: Sherrie George, MD;  Location: [facility] OR;  Service: Ophthalmology;  Laterality: Left;  . Photocoagulation with laser Left 10/16/2013    Procedure: PHOTOCOAGULATION WITH LASER;  Surgeon: Sherrie George, MD;  Location: [facility] OR;  Service: Ophthalmology;  Laterality: Left;  ENDOLASER  . Perfluorone injection Left 10/16/2013    Procedure: PERFLUORON  INJECTION;  Surgeon: Sherrie George, MD;  Location: [facility] OR;  Service: Ophthalmology;  Laterality: Left;  Marland Kitchen Membrane peel Left 10/16/2013    Procedure: MEMBRANE PEEL;  Surgeon: Sherrie George, MD;  Location: [facility] OR;  Service: Ophthalmology;  Laterality: Left;  . Iridectomy Left 10/16/2013    Procedure: IRIDECTOMY;  Surgeon: Sherrie George, MD;  Location: [facility] OR;  Service: Ophthalmology;  Laterality: Left;  . Injection of silicone oil Left 10/16/2013    Procedure: INJECTION OF SILICONE OIL;  Surgeon: Sherrie George, MD;  Location: [facility] OR;  Service: Ophthalmology;  Laterality: Left;    No family history on file. Social History:  reports that he quit smoking about 18 months ago. His smoking use included Cigarettes. He smoked  0.00 packs per day. He has quit using smokeless tobacco. His smokeless tobacco use included Snuff and Chew. He reports that he drinks about 12.6 ounces of alcohol per week. He reports that he does not use illicit drugs.  Allergies: No Known Allergies  No prescriptions prior to admission    Review of systems otherwise negative  There were no vitals taken for this visit.  Physical exam: Mental status: oriented x3. Eyes: See eye exam associated with this date of surgery in media tab.  Scanned in by scanning center Ears, Nose, Throat: within normal limits Neck: Within Normal limits General: within normal limits Chest: Within normal limits Breast: deferred Heart: Within normal limits Abdomen: Within normal limits GU: deferred Extremities: within normal limits Skin: within normal limits  Assessment/Plan Silicone oil present in vitreous cavity left eye Plan: To [facility] for Pars plana vitrectomy, removal of silicone oil, laser, gas injection left eye.  Sherrie George 06/04/2014, 3:50 PM

## 2014-06-07 ENCOUNTER — Encounter (HOSPITAL_COMMUNITY): Payer: Self-pay | Admitting: *Deleted

## 2014-06-10 MED ORDER — PHENYLEPHRINE HCL 2.5 % OP SOLN: 1.0000 [drp] | OPHTHALMIC | Status: DC | PRN

## 2014-06-10 MED ORDER — CYCLOPENTOLATE HCL 1 % OP SOLN: 1.0000 [drp] | OPHTHALMIC | Status: DC | PRN

## 2014-06-10 MED ORDER — CEFAZOLIN SODIUM-DEXTROSE 2-3 GM-% IV SOLR
2.0000 g | INTRAVENOUS | Status: AC
  Administered 2014-06-11: 2 g via INTRAVENOUS
  Filled 2014-06-10: qty 50

## 2014-06-10 MED ORDER — TROPICAMIDE 1 % OP SOLN: 1.0000 [drp] | OPHTHALMIC | Status: DC | PRN

## 2014-06-10 MED ORDER — GATIFLOXACIN 0.5 % OP SOLN: 1.0000 [drp] | OPHTHALMIC | Status: DC | PRN

## 2014-06-11 ENCOUNTER — Encounter (INDEPENDENT_AMBULATORY_CARE_PROVIDER_SITE_OTHER): Payer: 59 | Admitting: Ophthalmology

## 2014-06-11 ENCOUNTER — Encounter (HOSPITAL_COMMUNITY)
Admission: RE | Disposition: A | Discharge status: Home / Self Care | Payer: Self-pay | Source: Ambulatory Visit | Attending: Ophthalmology

## 2014-06-11 ENCOUNTER — Encounter (HOSPITAL_COMMUNITY): Payer: 59

## 2014-06-11 ENCOUNTER — Encounter (HOSPITAL_COMMUNITY): Payer: Self-pay | Admitting: Certified Registered Nurse Anesthetist

## 2014-06-11 ENCOUNTER — Ambulatory Visit (HOSPITAL_COMMUNITY): Payer: 59

## 2014-06-11 ENCOUNTER — Ambulatory Visit (HOSPITAL_COMMUNITY)
Admission: RE | Admit: 2014-06-11 | Discharge: 2014-06-12 | Disposition: A | Discharge status: Home / Self Care | Payer: 59 | Source: Ambulatory Visit | Attending: Ophthalmology | Admitting: Ophthalmology

## 2014-06-11 DIAGNOSIS — H33002 Unspecified retinal detachment with retinal break, left eye: Secondary | ICD-10-CM | POA: Diagnosis present

## 2014-06-11 DIAGNOSIS — S0510XA Contusion of eyeball and orbital tissues, unspecified eye, initial encounter: Secondary | ICD-10-CM

## 2014-06-11 DIAGNOSIS — H33009 Unspecified retinal detachment with retinal break, unspecified eye: Secondary | ICD-10-CM

## 2014-06-11 DIAGNOSIS — H43819 Vitreous degeneration, unspecified eye: Secondary | ICD-10-CM

## 2014-06-11 HISTORY — PX: PARS PLANA VITRECTOMY: SHX2166

## 2014-06-11 HISTORY — PX: GAS/FLUID EXCHANGE: SHX5334

## 2014-06-11 LAB — CBC
HCT: 37.7 % — ABNORMAL LOW (ref 39.0–52.0)
HEMOGLOBIN: 13.2 g/dL (ref 13.0–17.0)
MCH: 32.2 pg (ref 26.0–34.0)
MCHC: 35 g/dL (ref 30.0–36.0)
MCV: 92 fL (ref 78.0–100.0)
PLATELETS: 277 10*3/uL (ref 150–400)
RBC: 4.1 MIL/uL — ABNORMAL LOW (ref 4.22–5.81)
RDW: 12.4 % (ref 11.5–15.5)
WBC: 7.5 10*3/uL (ref 4.0–10.5)

## 2014-06-11 SURGERY — 25 GAUGE PARS PLANA VITRECTOMY WITH 23 GAUGE MVR PORT
Anesthesia: General | Site: Eye | Laterality: Left

## 2014-06-11 MED ORDER — LOTEPREDNOL ETABONATE 0.5 % OP SUSP
1.0000 [drp] | Freq: Four times a day (QID) | OPHTHALMIC | Status: DC
  Filled 2014-06-11: qty 5

## 2014-06-11 MED ORDER — TETRACAINE HCL 0.5 % OP SOLN
2.0000 [drp] | Freq: Once | OPHTHALMIC | Status: DC
  Filled 2014-06-11: qty 2

## 2014-06-11 MED ORDER — BSS PLUS IO SOLN: INTRAOCULAR | Status: DC | PRN

## 2014-06-11 MED ORDER — FENTANYL CITRATE 0.05 MG/ML IJ SOLN
25.0000 ug | INTRAMUSCULAR | Status: DC | PRN
  Administered 2014-06-11: 50 ug via INTRAVENOUS

## 2014-06-11 MED ORDER — DOCUSATE SODIUM 100 MG PO CAPS
100.0000 mg | ORAL_CAPSULE | Freq: Two times a day (BID) | ORAL | Status: DC
  Filled 2014-06-11: qty 1

## 2014-06-11 MED ORDER — BUPIVACAINE HCL (PF) 0.75 % IJ SOLN
INTRAMUSCULAR | Status: AC
  Filled 2014-06-11: qty 10

## 2014-06-11 MED ORDER — FENTANYL CITRATE 0.05 MG/ML IJ SOLN
INTRAMUSCULAR | Status: AC
  Filled 2014-06-11: qty 5

## 2014-06-11 MED ORDER — BACITRACIN-POLYMYXIN B 500-10000 UNIT/GM OP OINT: 1.0000 "application " | TOPICAL_OINTMENT | Freq: Four times a day (QID) | OPHTHALMIC | Status: DC

## 2014-06-11 MED ORDER — CYCLOPENTOLATE HCL 1 % OP SOLN
1.0000 [drp] | OPHTHALMIC | Status: AC | PRN
  Administered 2014-06-11 (×3): 1 [drp] via OPHTHALMIC
  Filled 2014-06-11: qty 2

## 2014-06-11 MED ORDER — PROPOFOL 10 MG/ML IV BOLUS
INTRAVENOUS | Status: AC
  Filled 2014-06-11: qty 20

## 2014-06-11 MED ORDER — SUCCINYLCHOLINE CHLORIDE 20 MG/ML IJ SOLN
INTRAMUSCULAR | Status: AC
  Filled 2014-06-11: qty 1

## 2014-06-11 MED ORDER — SODIUM CHLORIDE 0.9 % IV SOLN
INTRAVENOUS | Status: DC
Start: 2014-06-11 — End: 2014-06-11
  Administered 2014-06-11: 11:00:00 via INTRAVENOUS

## 2014-06-11 MED ORDER — OXYCODONE HCL 5 MG PO TABS: 5.0000 mg | ORAL_TABLET | Freq: Once | ORAL | Status: DC | PRN

## 2014-06-11 MED ORDER — NEOSTIGMINE METHYLSULFATE 10 MG/10ML IV SOLN
INTRAVENOUS | Status: AC
  Filled 2014-06-11: qty 2

## 2014-06-11 MED ORDER — ACETAZOLAMIDE ER 500 MG PO CP12
500.0000 mg | ORAL_CAPSULE | Freq: Two times a day (BID) | ORAL | Status: DC
  Administered 2014-06-11 (×2): 500 mg via ORAL
  Filled 2014-06-11 (×4): qty 1

## 2014-06-11 MED ORDER — POLYMYXIN B SULFATE 500000 UNITS IJ SOLR
INTRAMUSCULAR | Status: AC
  Filled 2014-06-11: qty 1

## 2014-06-11 MED ORDER — FENTANYL CITRATE 0.05 MG/ML IJ SOLN: INTRAMUSCULAR | Status: DC | PRN

## 2014-06-11 MED ORDER — SODIUM CHLORIDE 0.9 % IJ SOLN
INTRAMUSCULAR | Status: AC
Start: 2014-06-11 — End: 2014-06-11
  Filled 2014-06-11: qty 10

## 2014-06-11 MED ORDER — GLYCOPYRROLATE 0.2 MG/ML IJ SOLN
INTRAMUSCULAR | Status: AC
  Filled 2014-06-11: qty 4

## 2014-06-11 MED ORDER — BRIMONIDINE TARTRATE 0.15 % OP SOLN: 1.0000 [drp] | Freq: Three times a day (TID) | OPHTHALMIC | Status: DC

## 2014-06-11 MED ORDER — PREDNISOLONE ACETATE 1 % OP SUSP: 1.0000 [drp] | Freq: Four times a day (QID) | OPHTHALMIC | Status: DC

## 2014-06-11 MED ORDER — ACETAZOLAMIDE SODIUM 500 MG IJ SOLR
500.0000 mg | Freq: Once | INTRAMUSCULAR | Status: AC
  Administered 2014-06-12: 500 mg via INTRAVENOUS
  Filled 2014-06-11: qty 500

## 2014-06-11 MED ORDER — LATANOPROST 0.005 % OP SOLN
1.0000 [drp] | Freq: Every day | OPHTHALMIC | Status: DC
  Filled 2014-06-11: qty 2.5

## 2014-06-11 MED ORDER — MIDAZOLAM HCL 5 MG/5ML IJ SOLN
INTRAMUSCULAR | Status: DC | PRN
  Administered 2014-06-11: 2 mg via INTRAVENOUS

## 2014-06-11 MED ORDER — MAGNESIUM HYDROXIDE 400 MG/5ML PO SUSP: 15.0000 mL | Freq: Four times a day (QID) | ORAL | Status: DC | PRN

## 2014-06-11 MED ORDER — DEXAMETHASONE SODIUM PHOSPHATE 10 MG/ML IJ SOLN
INTRAMUSCULAR | Status: AC
  Filled 2014-06-11: qty 1

## 2014-06-11 MED ORDER — PHENYLEPHRINE HCL 2.5 % OP SOLN
1.0000 [drp] | OPHTHALMIC | Status: AC | PRN
  Administered 2014-06-11 (×3): 1 [drp] via OPHTHALMIC
  Filled 2014-06-11: qty 2

## 2014-06-11 MED ORDER — LIDOCAINE HCL (CARDIAC) 20 MG/ML IV SOLN
INTRAVENOUS | Status: DC | PRN
  Administered 2014-06-11: 60 mg via INTRAVENOUS

## 2014-06-11 MED ORDER — STERILE WATER FOR INJECTION IJ SOLN
INTRAMUSCULAR | Status: AC
  Filled 2014-06-11: qty 10

## 2014-06-11 MED ORDER — ROCURONIUM BROMIDE 100 MG/10ML IV SOLN
INTRAVENOUS | Status: DC | PRN
  Administered 2014-06-11: 20 mg via INTRAVENOUS

## 2014-06-11 MED ORDER — MIDAZOLAM HCL 2 MG/2ML IJ SOLN
INTRAMUSCULAR | Status: AC
  Filled 2014-06-11: qty 2

## 2014-06-11 MED ORDER — LATANOPROST 0.005 % OP SOLN: 1.0000 [drp] | Freq: Every day | OPHTHALMIC | Status: DC

## 2014-06-11 MED ORDER — TROPICAMIDE 1 % OP SOLN
1.0000 [drp] | OPHTHALMIC | Status: AC | PRN
  Administered 2014-06-11 (×3): 1 [drp] via OPHTHALMIC
  Filled 2014-06-11: qty 3

## 2014-06-11 MED ORDER — TEMAZEPAM 15 MG PO CAPS
15.0000 mg | ORAL_CAPSULE | Freq: Every evening | ORAL | Status: DC | PRN
  Administered 2014-06-11: 15 mg via ORAL
  Filled 2014-06-11: qty 1

## 2014-06-11 MED ORDER — BACITRACIN-POLYMYXIN B 500-10000 UNIT/GM OP OINT
TOPICAL_OINTMENT | OPHTHALMIC | Status: DC | PRN
  Administered 2014-06-11: 1 via OPHTHALMIC

## 2014-06-11 MED ORDER — BUPIVACAINE HCL (PF) 0.75 % IJ SOLN
INTRAMUSCULAR | Status: DC | PRN
  Administered 2014-06-11: 20 mL

## 2014-06-11 MED ORDER — HYDROCODONE-ACETAMINOPHEN 5-325 MG PO TABS: 1.0000 | ORAL_TABLET | ORAL | Status: DC | PRN

## 2014-06-11 MED ORDER — GATIFLOXACIN 0.5 % OP SOLN: 1.0000 [drp] | Freq: Four times a day (QID) | OPHTHALMIC | Status: DC

## 2014-06-11 MED ORDER — LOTEPREDNOL ETABONATE 0.5 % OP GEL: Freq: Four times a day (QID) | OPHTHALMIC | Status: DC

## 2014-06-11 MED ORDER — FENTANYL CITRATE 0.05 MG/ML IJ SOLN
INTRAMUSCULAR | Status: AC
  Administered 2014-06-11: 50 ug via INTRAVENOUS
  Filled 2014-06-11: qty 2

## 2014-06-11 MED ORDER — DEXAMETHASONE SODIUM PHOSPHATE 10 MG/ML IJ SOLN
INTRAMUSCULAR | Status: DC | PRN
  Administered 2014-06-11: 10 mg

## 2014-06-11 MED ORDER — PREDNISOLONE ACETATE 1 % OP SUSP
1.0000 [drp] | Freq: Four times a day (QID) | OPHTHALMIC | Status: DC
  Filled 2014-06-11: qty 5

## 2014-06-11 MED ORDER — TIMOLOL MALEATE 0.5 % OP SOLN: 1.0000 [drp] | Freq: Two times a day (BID) | OPHTHALMIC | Status: DC

## 2014-06-11 MED ORDER — GATIFLOXACIN 0.5 % OP SOLN
1.0000 [drp] | OPHTHALMIC | Status: AC | PRN
  Administered 2014-06-11 (×3): 1 [drp] via OPHTHALMIC
  Filled 2014-06-11: qty 2.5

## 2014-06-11 MED ORDER — SODIUM HYALURONATE 10 MG/ML IO SOLN
INTRAOCULAR | Status: AC
  Filled 2014-06-11: qty 0.85

## 2014-06-11 MED ORDER — ACETAMINOPHEN 325 MG PO TABS: 325.0000 mg | ORAL_TABLET | ORAL | Status: DC | PRN

## 2014-06-11 MED ORDER — ONDANSETRON HCL 4 MG/2ML IJ SOLN
INTRAMUSCULAR | Status: DC | PRN
  Administered 2014-06-11: 4 mg via INTRAVENOUS

## 2014-06-11 MED ORDER — EPHEDRINE SULFATE 50 MG/ML IJ SOLN
INTRAMUSCULAR | Status: AC
  Filled 2014-06-11: qty 1

## 2014-06-11 MED ORDER — ONDANSETRON HCL 4 MG/2ML IJ SOLN
INTRAMUSCULAR | Status: AC
  Filled 2014-06-11: qty 2

## 2014-06-11 MED ORDER — 0.9 % SODIUM CHLORIDE (POUR BTL) OPTIME
TOPICAL | Status: DC | PRN
  Administered 2014-06-11: 1000 mL

## 2014-06-11 MED ORDER — SODIUM CHLORIDE 0.9 % IJ SOLN
INTRAMUSCULAR | Status: DC | PRN
  Administered 2014-06-11: 11:00:00

## 2014-06-11 MED ORDER — GENTAMICIN SULFATE 40 MG/ML IJ SOLN
INTRAMUSCULAR | Status: AC
  Filled 2014-06-11: qty 2

## 2014-06-11 MED ORDER — FENTANYL CITRATE 0.05 MG/ML IJ SOLN
INTRAMUSCULAR | Status: DC | PRN
  Administered 2014-06-11 (×2): 50 ug via INTRAVENOUS

## 2014-06-11 MED ORDER — ATROPINE SULFATE 1 % OP SOLN
OPHTHALMIC | Status: DC | PRN
  Administered 2014-06-11: 1 [drp] via OPHTHALMIC

## 2014-06-11 MED ORDER — GLYCOPYRROLATE 0.2 MG/ML IJ SOLN
INTRAMUSCULAR | Status: DC | PRN
  Administered 2014-06-11: .6 mg via INTRAVENOUS

## 2014-06-11 MED ORDER — ONDANSETRON HCL 4 MG/2ML IJ SOLN: 4.0000 mg | Freq: Four times a day (QID) | INTRAMUSCULAR | Status: DC | PRN

## 2014-06-11 MED ORDER — MORPHINE SULFATE 2 MG/ML IJ SOLN
1.0000 mg | INTRAMUSCULAR | Status: AC | PRN
  Administered 2014-06-11 – 2014-06-12 (×2): 2 mg via INTRAVENOUS
  Filled 2014-06-11 (×2): qty 1

## 2014-06-11 MED ORDER — BSS PLUS IO SOLN
INTRAOCULAR | Status: AC
  Filled 2014-06-11: qty 500

## 2014-06-11 MED ORDER — SODIUM CHLORIDE 0.9 % IV SOLN
INTRAVENOUS | Status: DC | PRN
  Administered 2014-06-11 (×2): via INTRAVENOUS

## 2014-06-11 MED ORDER — BACITRACIN-POLYMYXIN B 500-10000 UNIT/GM OP OINT
1.0000 "application " | TOPICAL_OINTMENT | Freq: Four times a day (QID) | OPHTHALMIC | Status: DC
  Filled 2014-06-11: qty 3.5

## 2014-06-11 MED ORDER — PROPOFOL 10 MG/ML IV BOLUS
INTRAVENOUS | Status: DC | PRN
  Administered 2014-06-11: 170 mg via INTRAVENOUS

## 2014-06-11 MED ORDER — SODIUM CHLORIDE 0.45 % IV SOLN
INTRAVENOUS | Status: DC
  Administered 2014-06-11: 18:00:00 via INTRAVENOUS

## 2014-06-11 MED ORDER — TIMOLOL MALEATE 0.5 % OP SOLN
1.0000 [drp] | Freq: Two times a day (BID) | OPHTHALMIC | Status: DC
  Filled 2014-06-11: qty 5

## 2014-06-11 MED ORDER — EPINEPHRINE HCL 1 MG/ML IJ SOLN
INTRAMUSCULAR | Status: AC
  Filled 2014-06-11: qty 1

## 2014-06-11 MED ORDER — OXYCODONE HCL 5 MG/5ML PO SOLN: 5.0000 mg | Freq: Once | ORAL | Status: DC | PRN

## 2014-06-11 MED ORDER — BRIMONIDINE TARTRATE 0.2 % OP SOLN
1.0000 [drp] | Freq: Two times a day (BID) | OPHTHALMIC | Status: DC
  Filled 2014-06-11: qty 5

## 2014-06-11 MED ORDER — ROCURONIUM BROMIDE 50 MG/5ML IV SOLN
INTRAVENOUS | Status: AC
  Filled 2014-06-11: qty 1

## 2014-06-11 MED ORDER — SODIUM HYALURONATE 10 MG/ML IO SOLN
INTRAOCULAR | Status: DC | PRN
  Administered 2014-06-11: 0.85 mL via INTRAOCULAR

## 2014-06-11 MED ORDER — GATIFLOXACIN 0.5 % OP SOLN
1.0000 [drp] | Freq: Four times a day (QID) | OPHTHALMIC | Status: DC
  Filled 2014-06-11: qty 2.5

## 2014-06-11 MED ORDER — BACITRACIN-POLYMYXIN B 500-10000 UNIT/GM OP OINT
TOPICAL_OINTMENT | OPHTHALMIC | Status: AC
  Filled 2014-06-11: qty 3.5

## 2014-06-11 MED ORDER — NEOSTIGMINE METHYLSULFATE 10 MG/10ML IV SOLN
INTRAVENOUS | Status: DC | PRN
  Administered 2014-06-11: 4 mg via INTRAVENOUS

## 2014-06-11 MED ORDER — LIDOCAINE HCL 4 % MT SOLN
OROMUCOSAL | Status: DC | PRN
  Administered 2014-06-11: 4 mL via TOPICAL

## 2014-06-11 MED ORDER — PROMETHAZINE HCL 25 MG/ML IJ SOLN: 6.2500 mg | INTRAMUSCULAR | Status: DC | PRN

## 2014-06-11 MED ORDER — EPINEPHRINE HCL 1 MG/ML IJ SOLN
INTRAOCULAR | Status: DC | PRN
  Administered 2014-06-11: 11:00:00

## 2014-06-11 MED ORDER — LIDOCAINE HCL (CARDIAC) 20 MG/ML IV SOLN
INTRAVENOUS | Status: AC
Start: 2014-06-11 — End: 2014-06-11
  Filled 2014-06-11: qty 15

## 2014-06-11 MED ORDER — ATROPINE SULFATE 1 % OP SOLN
OPHTHALMIC | Status: AC
  Filled 2014-06-11: qty 2

## 2014-06-11 SURGICAL SUPPLY — 41 items
CANNULA TROCAR 23 GA VLV (OPHTHALMIC) ×3 IMPLANT
CANNULA VLV SOFT TIP 25GA (OPHTHALMIC) ×3 IMPLANT
COTTONBALL LRG STERILE PKG (GAUZE/BANDAGES/DRESSINGS) ×9 IMPLANT
COVER MAYO STAND STRL (DRAPES) ×3 IMPLANT
DRAPE INCISE 51X51 W/FILM STRL (DRAPES) ×3 IMPLANT
DRAPE OPHTHALMIC 77X100 STRL (CUSTOM PROCEDURE TRAY) ×3 IMPLANT
FILTER STRAW FLUID ASPIR (MISCELLANEOUS) ×3 IMPLANT
GLOVE SS BIOGEL STRL SZ 6.5 (GLOVE) ×1 IMPLANT
GLOVE SS BIOGEL STRL SZ 7 (GLOVE) ×1 IMPLANT
GLOVE SUPERSENSE BIOGEL SZ 6.5 (GLOVE) ×2
GLOVE SUPERSENSE BIOGEL SZ 7 (GLOVE) ×2
GLOVE SURG 8.5 LATEX PF (GLOVE) ×3 IMPLANT
GOWN STRL REUS W/ TWL LRG LVL3 (GOWN DISPOSABLE) ×3 IMPLANT
GOWN STRL REUS W/TWL LRG LVL3 (GOWN DISPOSABLE) ×6
KIT BASIN OR (CUSTOM PROCEDURE TRAY) ×3 IMPLANT
KIT ROOM TURNOVER OR (KITS) ×3 IMPLANT
NEEDLE 18GX1X1/2 (RX/OR ONLY) (NEEDLE) ×3 IMPLANT
NEEDLE 25GX 5/8IN NON SAFETY (NEEDLE) ×3 IMPLANT
NEEDLE FILTER BLUNT 18X 1/2SAF (NEEDLE) ×2
NEEDLE FILTER BLUNT 18X1 1/2 (NEEDLE) ×1 IMPLANT
NEEDLE HYPO 30X.5 LL (NEEDLE) ×6 IMPLANT
NS IRRIG 1000ML POUR BTL (IV SOLUTION) ×3 IMPLANT
PACK VITRECTOMY CUSTOM (CUSTOM PROCEDURE TRAY) ×3 IMPLANT
PAD ARMBOARD 7.5X6 YLW CONV (MISCELLANEOUS) ×6 IMPLANT
PAK PIK VITRECTOMY CVS 25GA (OPHTHALMIC) ×3 IMPLANT
PIC ILLUMINATED 25G (OPHTHALMIC) ×3
PIK ILLUMINATED 25G (OPHTHALMIC) ×1 IMPLANT
PROBE LASER ILLUM FLEX CVD 25G (OPHTHALMIC) ×3 IMPLANT
REPL STRA BRUSH NEEDLE (NEEDLE) ×3 IMPLANT
RESERVOIR BACK FLUSH (MISCELLANEOUS) ×3 IMPLANT
ROLLS DENTAL (MISCELLANEOUS) ×6 IMPLANT
SCRAPER DIAMOND 25GA (OPHTHALMIC RELATED) ×3 IMPLANT
SPONGE SURGIFOAM ABS GEL 12-7 (HEMOSTASIS) ×3 IMPLANT
STOPCOCK 4 WAY LG BORE MALE ST (IV SETS) ×3 IMPLANT
SYR 20CC LL (SYRINGE) ×3 IMPLANT
SYR BULB 3OZ (MISCELLANEOUS) ×3 IMPLANT
SYR TB 1ML LUER SLIP (SYRINGE) ×3 IMPLANT
SYRINGE 10CC LL (SYRINGE) ×3 IMPLANT
TOWEL OR 17X26 10 PK STRL BLUE (TOWEL DISPOSABLE) ×3 IMPLANT
WATER STERILE IRR 1000ML POUR (IV SOLUTION) ×3 IMPLANT
WIPE INSTRUMENT VISIWIPE 73X73 (MISCELLANEOUS) ×3 IMPLANT

## 2014-06-11 NOTE — H&P (Signed)
I examined the patient today and there is no change in the medical status 

## 2014-06-11 NOTE — Transfer of Care (Signed)
Immediate Anesthesia Transfer of Care Note  Patient: Sean Walker  Procedure(s) Performed: Procedure(s): 25 GAUGE PARS PLANA VITRECTOMY WITH 23 GAUGE MVR PORT WITH SILICONE OIL REMOVAL (Left) GAS/FLUID EXCHANGE (Left)  Patient Location: PACU  Anesthesia Type:General  Level of Consciousness: awake, alert  and oriented  Airway & Oxygen Therapy: Patient Spontanous Breathing and Patient connected to nasal cannula oxygen  Post-op Assessment: Report given to PACU RN, Post -op Vital signs reviewed and stable and Patient moving all extremities X 4  Post vital signs: Reviewed and stable  Complications: No apparent anesthesia complications

## 2014-06-11 NOTE — Brief Op Note (Signed)
Brief Operative note   Preoperative diagnosis:  retinal detachment left eye - removal of silicone oil Postoperative diagnosis  Post-Op Diagnosis Codes:    * Retinal detachment with retinal defect, unspecified [361.00]  Procedures: Pars plana vitrectomy, removal of silicone oil, gas fluid exchange left eye  Surgeon:  Sherrie George, MD...  Assistant:  Rosalie Doctor SA    Anesthesia: General  Specimen: none  Estimated blood loss:  1cc  Complications: none  Patient sent to PACU in good condition  Composed by Sherrie George MD  Dictation number: [phone] [phone]

## 2014-06-11 NOTE — Anesthesia Preprocedure Evaluation (Addendum)
Anesthesia Evaluation  Patient identified by MRN, date of birth, ID band Patient awake    Reviewed: Allergy & Precautions, H&P , NPO status , Patient's Chart, lab work & pertinent test results  Airway Mallampati: II TM Distance: >3 FB Neck ROM: Full    Dental  (+) Teeth Intact, Dental Advisory Given, Poor Dentition   Pulmonary former smoker,  breath sounds clear to auscultation        Cardiovascular negative cardio ROS  Rhythm:Regular Rate:Normal     Neuro/Psych negative neurological ROS  negative psych ROS   GI/Hepatic negative GI ROS, Neg liver ROS,   Endo/Other  negative endocrine ROS  Renal/GU negative Renal ROS     Musculoskeletal negative musculoskeletal ROS (+)   Abdominal   Peds  Hematology negative hematology ROS (+)   Anesthesia Other Findings   Reproductive/Obstetrics negative OB ROS                         Anesthesia Physical Anesthesia Plan  ASA: I  Anesthesia Plan: General   Post-op Pain Management:    Induction: Intravenous  Airway Management Planned: Oral ETT  Additional Equipment: None  Intra-op Plan:   Post-operative Plan: Extubation in OR  Informed Consent: I have reviewed the patients History and Physical, chart, labs and discussed the procedure including the risks, benefits and alternatives for the proposed anesthesia with the patient or authorized representative who has indicated his/her understanding and acceptance.   Dental advisory given  Plan Discussed with: CRNA and Anesthesiologist  Anesthesia Plan Comments:         Anesthesia Quick Evaluation

## 2014-06-11 NOTE — Anesthesia Postprocedure Evaluation (Signed)
  Anesthesia Post-op Note  Patient: Sean Walker  Procedure(s) Performed: Procedure(s): 25 GAUGE PARS PLANA VITRECTOMY WITH 23 GAUGE MVR PORT WITH SILICONE OIL REMOVAL (Left) GAS/FLUID EXCHANGE (Left)  Patient Location: PACU  Anesthesia Type:General  Level of Consciousness: awake, alert  and oriented  Airway and Oxygen Therapy: Patient Spontanous Breathing  Post-op Pain: none  Post-op Assessment: Post-op Vital signs reviewed  Post-op Vital Signs: Reviewed  Last Vitals:  Filed Vitals:   06/11/14 1400  BP:   Pulse: 74  Temp:   Resp: 14    Complications: No apparent anesthesia complications

## 2014-06-11 NOTE — Op Note (Signed)
[name], Sean Walker             ACCOUNT NO.:  [id number]  MEDICAL RECORD NO.:  [id number]  LOCATION:  6N22C                        FACILITY:  [facility]  PHYSICIAN:  Beulah Gandy. Ashley Royalty, M.D. DATE OF BIRTH:  1976-08-15  DATE OF PROCEDURE:  06/11/2014 DATE OF DISCHARGE:                              OPERATIVE REPORT   ADMISSION DIAGNOSIS:  History of rhegmatogenous retinal detachment, silicone oil in the eye, peripheral membranes.  PROCEDURES:  Pars plana vitrectomy, removal of silicone oil, and gas- fluid exchange, left eye.  SURGEON:  Beulah Gandy. Ashley Royalty, M.D.  ASSISTANT:  Rosalie Doctor, SA.  ANESTHESIA:  General.  DETAILS:  Usual prep and drape, a 25-gauge trocars placed at 10 o'clock and 4 o'clock.  A 23-gauge trocar placed at 2 o'clock.  Provisc placed on the corneal surface.  Pars plana vitrectomy was begun just behind the pupillary axis.  Peripheral membranes were encountered and carefully removed under low suction and rapid cutting.  The 23-gauge silicone oil extractor was then inserted through the 23-gauge trocar at 2 o'clock. The silicone oil was gradually and slowly extracted with careful monitoring of the globe pressure and contents.  Once the oil edge came into the pupillary axis, the eye was tilted so that the oil extraction device was at the highest point.  An additional oil droplets were removed through this maneuver.  The lighted pick and the vitreous cutter were placed at 10 o'clock to remove oil from that location as well. Repeated attempts to remove oil from the pars plana area.  Once all the oil was removed, the 25-gauge vitrectomy was continued.  The vitrectomy was carried posteriorly down along the retinal surface where vitreous was carefully removed under low suction and rapid cutting.  Once all the vitreous was removed, the peripheral retina was inspected and the retina was lying nicely on the scleral buckle with no subretinal fluid remaining.  A 30% gas-fluid  exchange was then carried out.  The trocars were removed from the eye.  The wounds were held and tested until they were secured.  Polymyxin and gentamicin were irrigated into tenon space. Atropine solution was applied.  Decadron 0.5 mL was injected into the lower subconjunctival space.  Marcaine was injected around the globe for postop pain.  Polysporin ophthalmic ointment, patch and shield were placed.  Closing pressure was 10 with a Barraquer tonometer.  The patient was awakened and taken to recovery in satisfactory condition. Complications none.  Duration 1 hour.     Beulah Gandy. Ashley Royalty, M.D.    JDM/MEDQ  D:  06/11/2014  T:  06/11/2014  Job:  [id number]

## 2014-06-11 NOTE — Anesthesia Procedure Notes (Signed)
Procedure Name: Intubation Date/Time: 06/11/2014 11:27 AM Performed by: Vita Barley E Pre-anesthesia Checklist: Patient identified, Emergency Drugs available, Suction available and Patient being monitored Patient Re-evaluated:Patient Re-evaluated prior to inductionOxygen Delivery Method: Circle system utilized Preoxygenation: Pre-oxygenation with 100% oxygen Intubation Type: IV induction Ventilation: Mask ventilation without difficulty Laryngoscope Size: Miller and 2 Grade View: Grade II Tube type: Oral Tube size: 7.5 mm Number of attempts: 1 Airway Equipment and Method: Stylet Placement Confirmation: ETT inserted through vocal cords under direct vision,  positive ETCO2 and breath sounds checked- equal and bilateral Secured at: 22 cm Tube secured with: Tape Dental Injury: Teeth and Oropharynx as per pre-operative assessment

## 2014-06-12 DIAGNOSIS — H33009 Unspecified retinal detachment with retinal break, unspecified eye: Secondary | ICD-10-CM | POA: Diagnosis not present

## 2014-06-12 MED ORDER — LOTEPREDNOL ETABONATE 0.5 % OP SUSP: 1.0000 [drp] | Freq: Four times a day (QID) | OPHTHALMIC | Status: AC

## 2014-06-12 MED ORDER — LATANOPROST 0.005 % OP SOLN: 1.0000 [drp] | Freq: Every day | OPHTHALMIC | Status: AC

## 2014-06-12 MED ORDER — TIMOLOL MALEATE 0.5 % OP SOLN: 1.0000 [drp] | Freq: Two times a day (BID) | OPHTHALMIC | Status: AC

## 2014-06-12 MED ORDER — GATIFLOXACIN 0.5 % OP SOLN: 1.0000 [drp] | Freq: Four times a day (QID) | OPHTHALMIC | Status: AC

## 2014-06-12 MED ORDER — HYDROCODONE-ACETAMINOPHEN 5-325 MG PO TABS: 1.0000 | ORAL_TABLET | ORAL | Status: AC | PRN

## 2014-06-12 MED ORDER — BACITRACIN-POLYMYXIN B 500-10000 UNIT/GM OP OINT: 1.0000 "application " | TOPICAL_OINTMENT | Freq: Four times a day (QID) | OPHTHALMIC | Status: AC

## 2014-06-12 MED ORDER — BRIMONIDINE TARTRATE 0.2 % OP SOLN: 1.0000 [drp] | Freq: Two times a day (BID) | OPHTHALMIC | Status: AC

## 2014-06-12 NOTE — Progress Notes (Signed)
Discharge instructions gone over. Prescription was given by doctor. Eye medications in bag and instructions given to patient. Home medications gone over. Follow up appointment is made. Diet, activity, restrictions and reasons to call the doctor gone over. Gas bubble discussed. Good hand washing emphasized.  Patient verbalized understanding of instructions.

## 2014-06-12 NOTE — Discharge Summary (Signed)
Discharge summary not needed on [facility] patients per medical records. 

## 2014-06-12 NOTE — Progress Notes (Signed)
06/12/2014, 6:56 AM  Mental Status:  Awake, Alert, Oriented  Anterior segment: Cornea  Clear    Anterior Chamber Clear    Lens:   Aphakic  Intra Ocular Pressure 2 mmHg with Tonopen  Vitreous: Clear 30%gas bubble   Retina:  Attached Good laser reaction   Impression: Excellent result Retina attached   Final Diagnosis: Active Problems:   Rhegmatogenous retinal detachment of left eye   Plan: start post operative eye drops.   Delay glaucoma meds until tonight.  Hold Diamox.  Discharge to home.  Give post operative instructions  Sherrie George 06/12/2014, 6:56 AM

## 2014-06-13 ENCOUNTER — Encounter (HOSPITAL_COMMUNITY): Payer: Self-pay | Admitting: Ophthalmology

## 2014-06-14 ENCOUNTER — Inpatient Hospital Stay (INDEPENDENT_AMBULATORY_CARE_PROVIDER_SITE_OTHER): Payer: 59 | Admitting: Ophthalmology

## 2014-06-14 DIAGNOSIS — H33009 Unspecified retinal detachment with retinal break, unspecified eye: Secondary | ICD-10-CM

## 2014-06-17 ENCOUNTER — Inpatient Hospital Stay (INDEPENDENT_AMBULATORY_CARE_PROVIDER_SITE_OTHER): Payer: 59 | Admitting: Ophthalmology

## 2014-07-01 ENCOUNTER — Encounter (INDEPENDENT_AMBULATORY_CARE_PROVIDER_SITE_OTHER): Payer: 59 | Admitting: Ophthalmology

## 2014-07-01 DIAGNOSIS — H33009 Unspecified retinal detachment with retinal break, unspecified eye: Secondary | ICD-10-CM

## 2014-09-18 ENCOUNTER — Encounter (INDEPENDENT_AMBULATORY_CARE_PROVIDER_SITE_OTHER): Payer: 59 | Admitting: Ophthalmology

## 2014-09-18 DIAGNOSIS — H338 Other retinal detachments: Secondary | ICD-10-CM

## 2014-09-18 DIAGNOSIS — H43811 Vitreous degeneration, right eye: Secondary | ICD-10-CM

## 2015-01-20 ENCOUNTER — Ambulatory Visit (INDEPENDENT_AMBULATORY_CARE_PROVIDER_SITE_OTHER): Payer: 59 | Admitting: Ophthalmology

## 2015-08-16 IMAGING — CR DG CHEST 2V
2 series · 2 of 2 positions shown · non-contrast
Comparison: None.

CLINICAL DATA: Preop retinal surgery

EXAM:
CHEST  2 VIEW

[w chest pa]
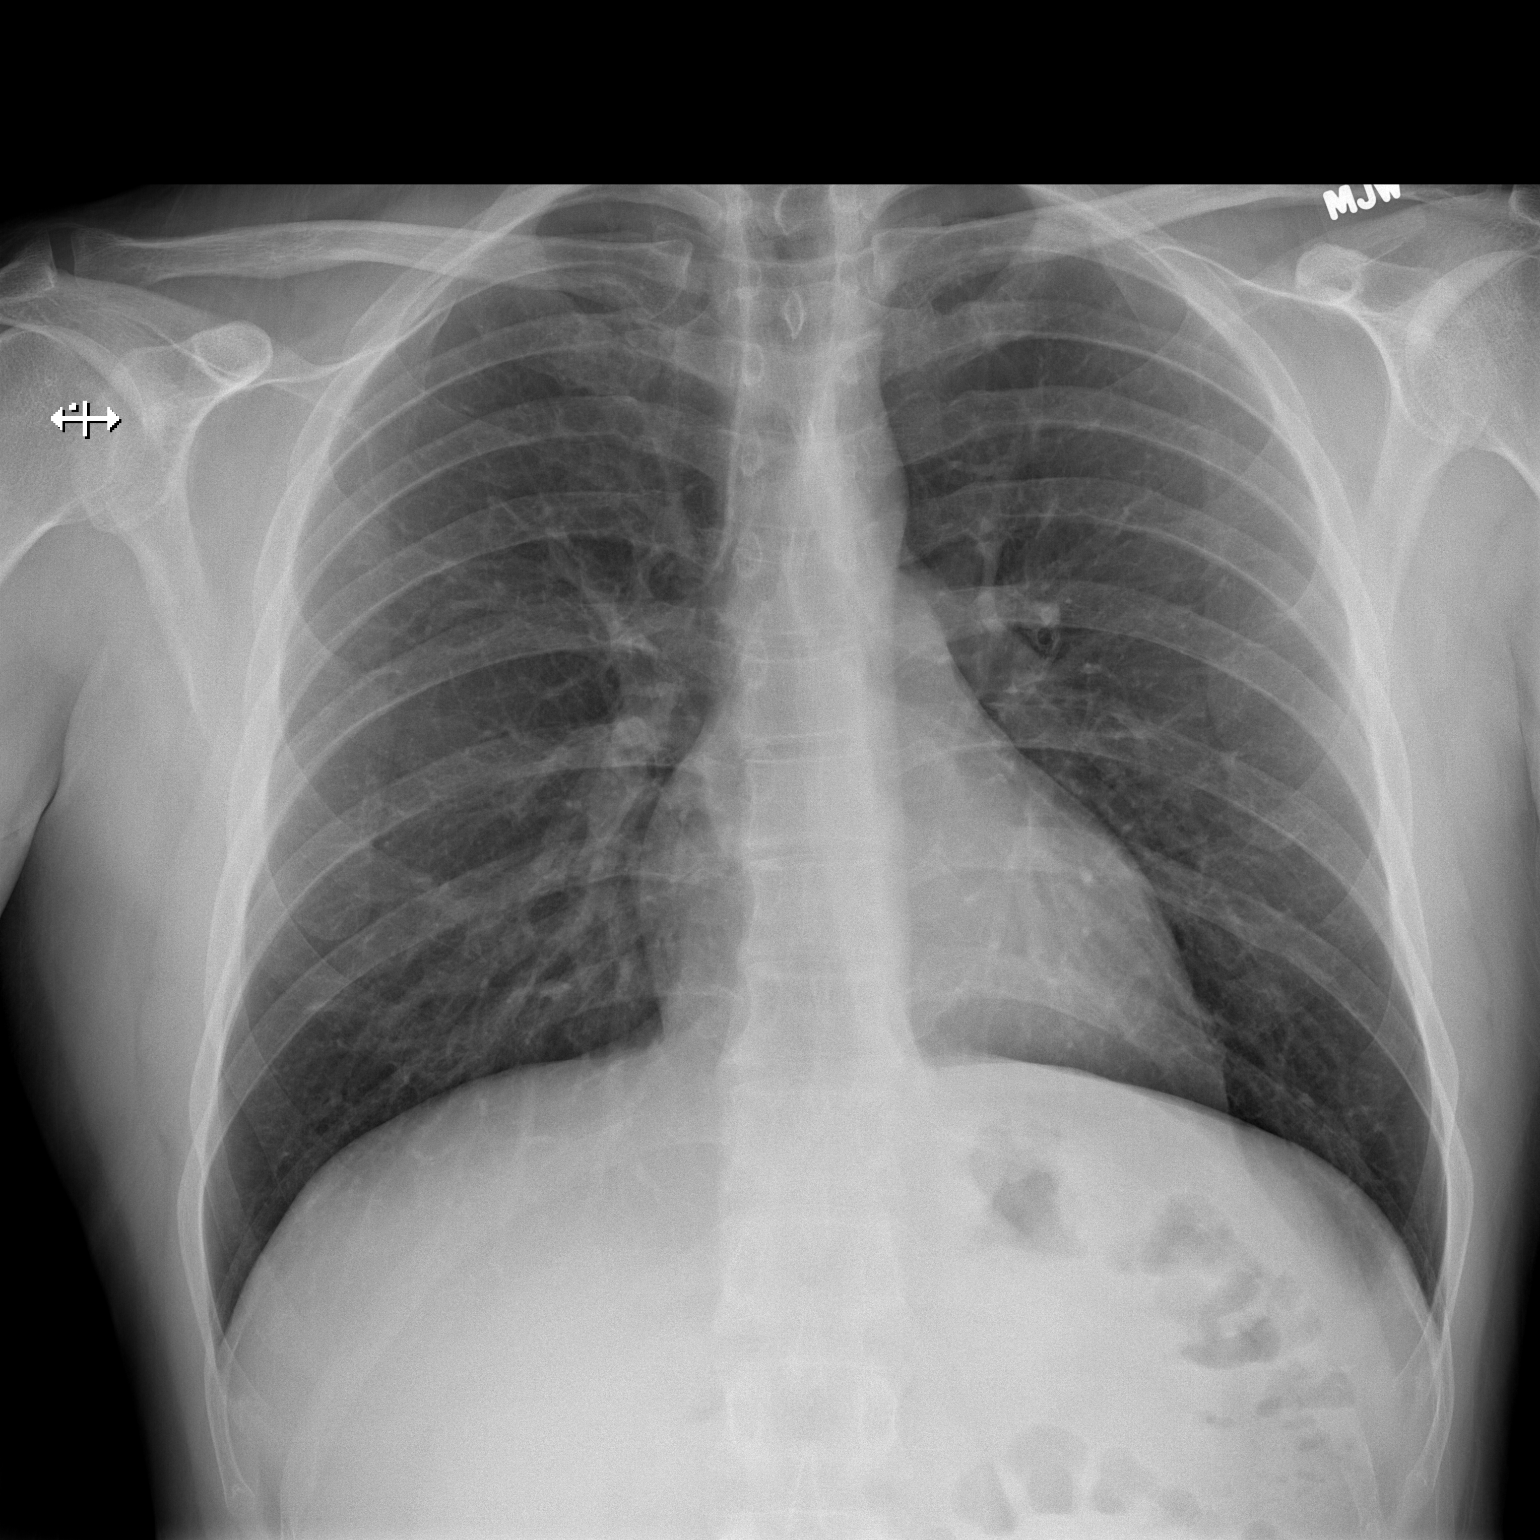

[w chest lat]
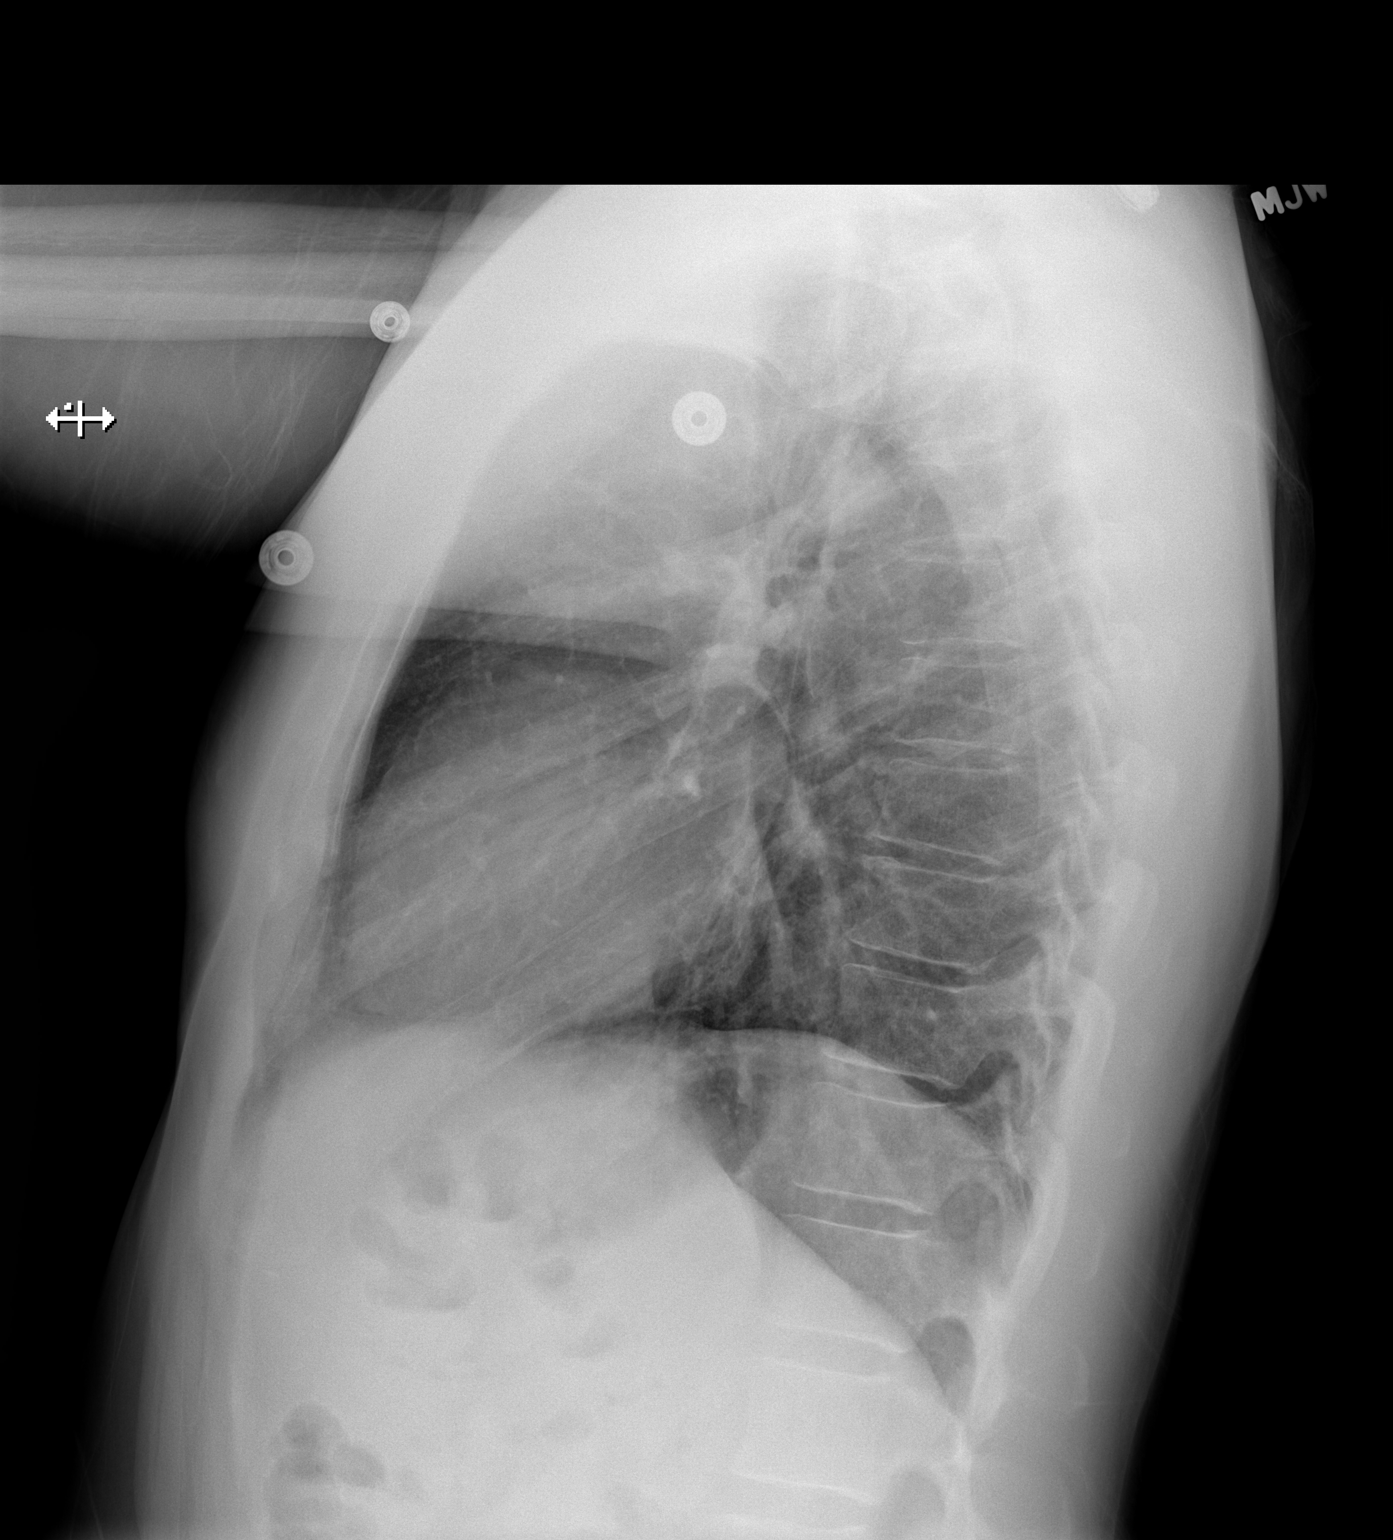

[2 of 2 positions shown; findings below may reference images not displayed]

FINDINGS: The heart size and mediastinal contours are within normal limits.
Both lungs are clear. The visualized skeletal structures are
unremarkable.
IMPRESSION: No active cardiopulmonary disease.

## 2016-04-10 IMAGING — CR DG CHEST 2V
2 series · 2 of 2 positions shown · non-contrast
Comparison: 10/16/2013

CLINICAL DATA: Preoperative evaluation for retinal surgery,
detached retina, former smoker

EXAM:
CHEST  2 VIEW

[w chest pa]
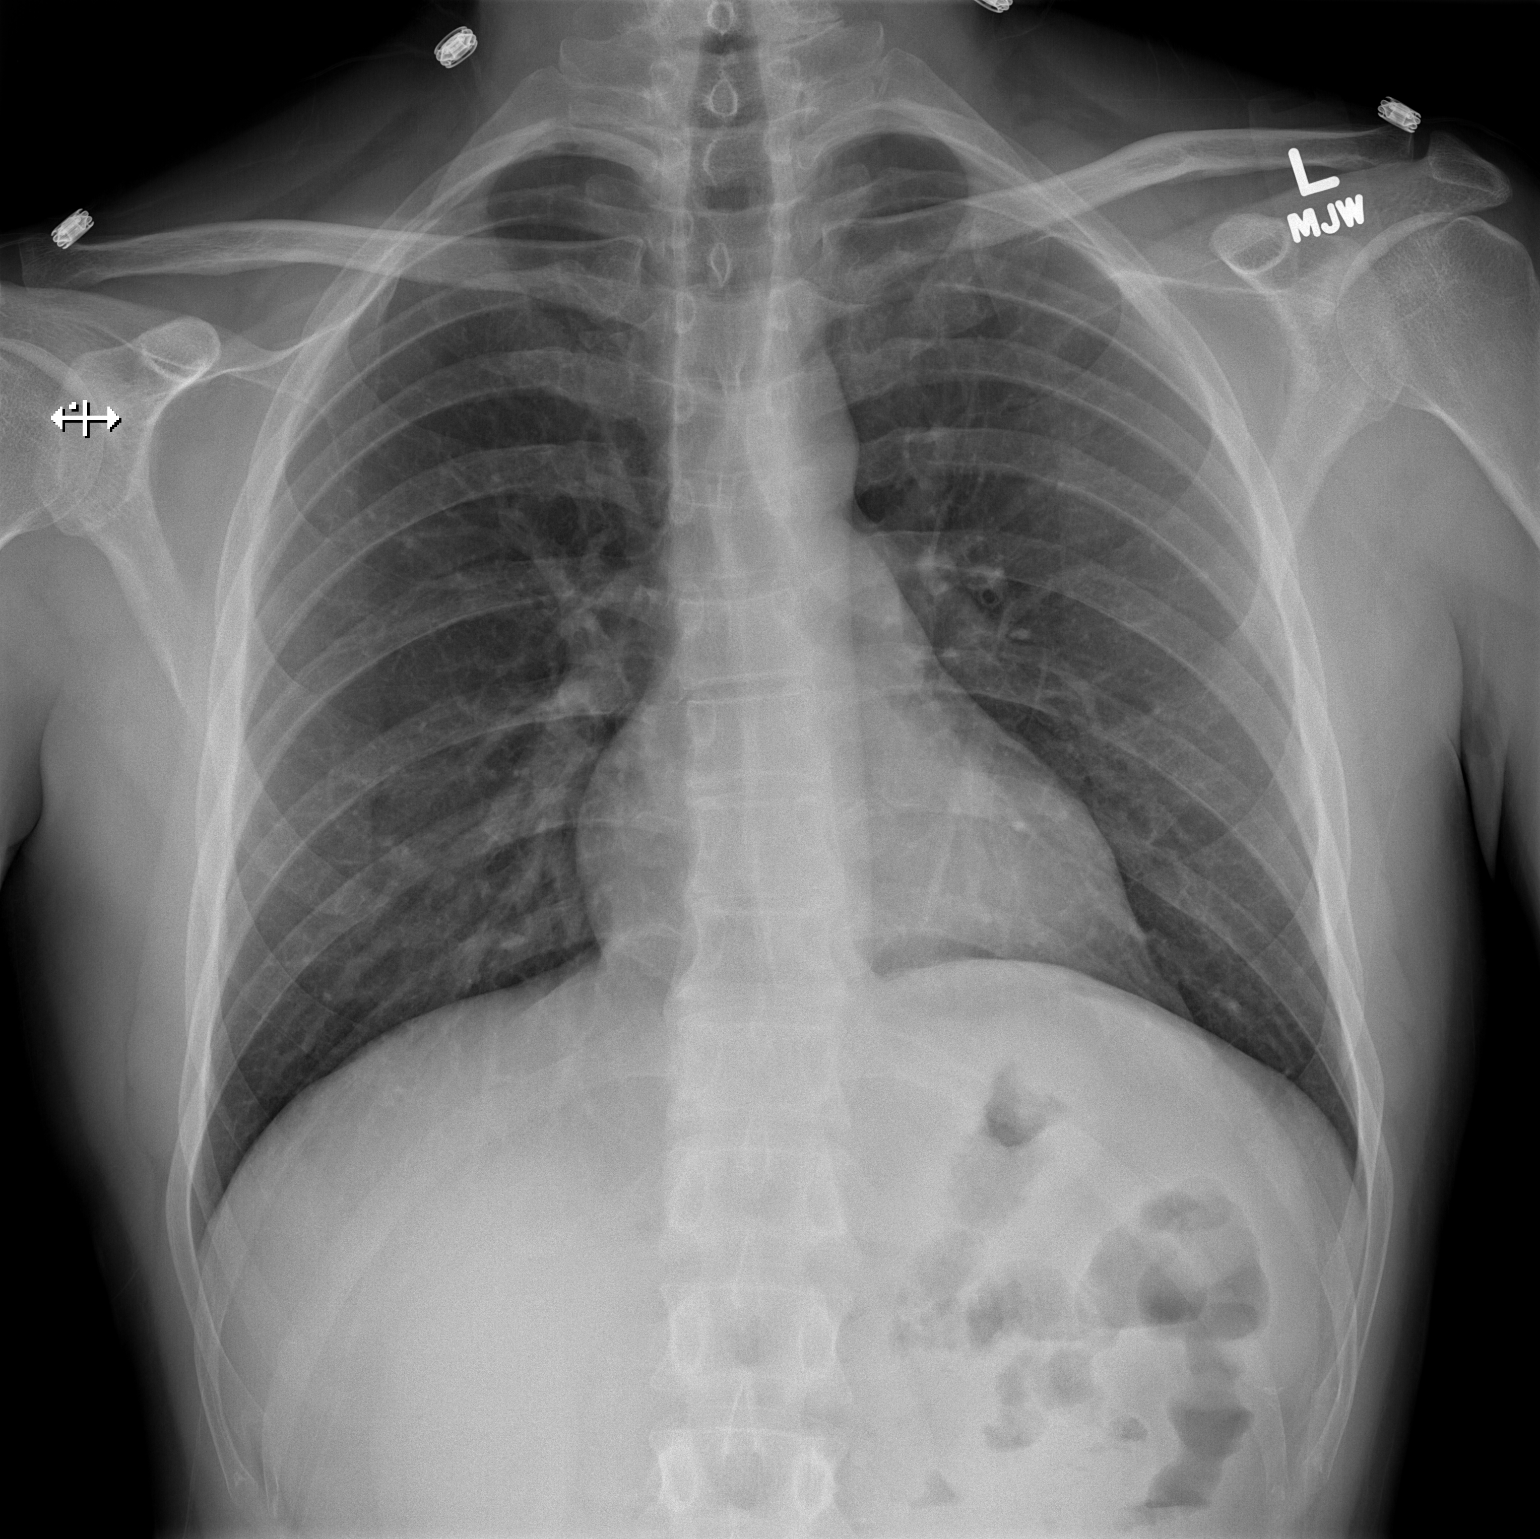

[w chest lat]
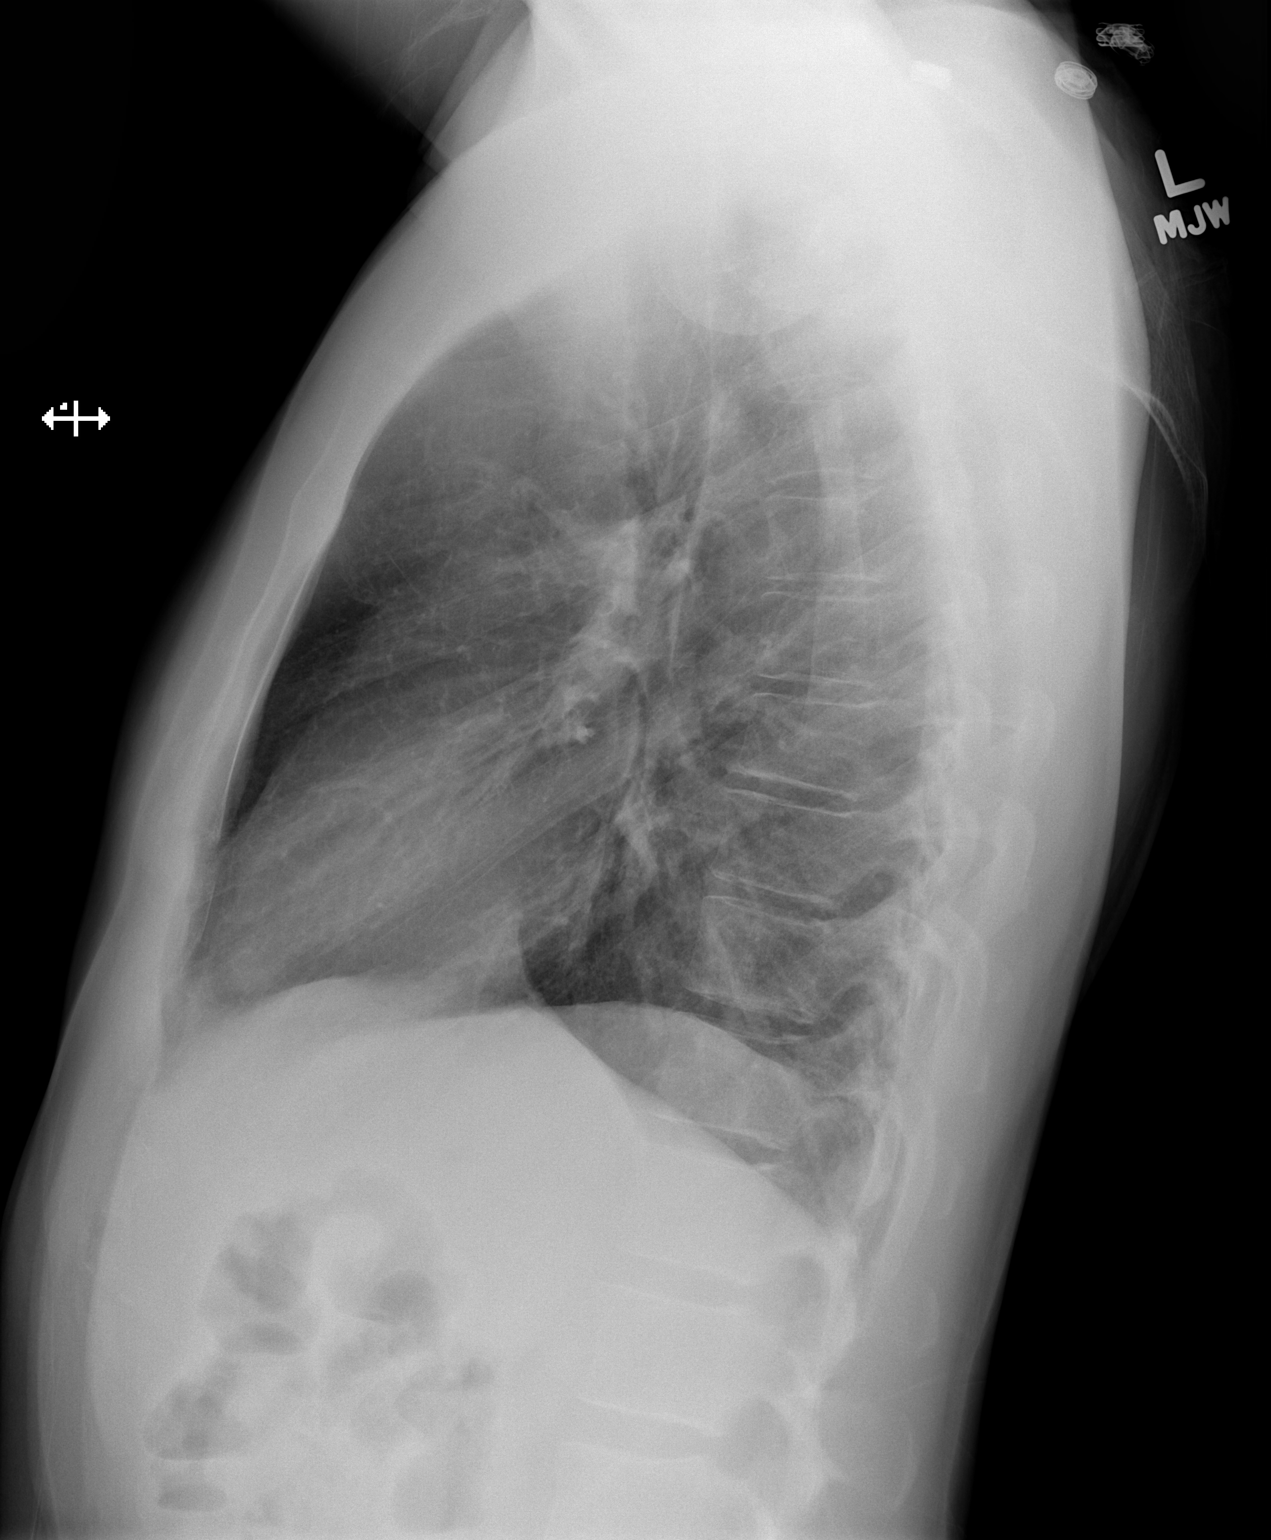

[2 of 2 positions shown; findings below may reference images not displayed]

FINDINGS: Normal heart size, mediastinal contours, and pulmonary vascularity.

Minimal chronic peribronchial thickening.

Lungs clear.

No pleural effusion or pneumothorax.

Bones unremarkable.
IMPRESSION: Minimal chronic bronchitic changes without acute infiltrate.
